# Patient Record
Sex: Female | Born: 1955 | Race: Black or African American | Hispanic: No | Marital: Married | Smoking: Former smoker
Health system: Southern US, Community
[De-identification: ages and names within clinical notes are randomized; demographics above are authoritative.]

## PROBLEM LIST (undated history)

## (undated) ENCOUNTER — Ambulatory Visit: Admission: EM | Payer: BC Managed Care – PPO | Source: Home / Self Care

## (undated) DIAGNOSIS — K635 Polyp of colon: Secondary | ICD-10-CM

## (undated) DIAGNOSIS — K5792 Diverticulitis of intestine, part unspecified, without perforation or abscess without bleeding: Secondary | ICD-10-CM

## (undated) DIAGNOSIS — R87619 Unspecified abnormal cytological findings in specimens from cervix uteri: Secondary | ICD-10-CM

## (undated) DIAGNOSIS — I10 Essential (primary) hypertension: Secondary | ICD-10-CM

## (undated) DIAGNOSIS — E7800 Pure hypercholesterolemia, unspecified: Secondary | ICD-10-CM

## (undated) DIAGNOSIS — D649 Anemia, unspecified: Secondary | ICD-10-CM

## (undated) DIAGNOSIS — E119 Type 2 diabetes mellitus without complications: Secondary | ICD-10-CM

## (undated) HISTORY — DX: Essential (primary) hypertension: I10

## (undated) HISTORY — DX: Pure hypercholesterolemia, unspecified: E78.00

## (undated) HISTORY — DX: Type 2 diabetes mellitus without complications: E11.9

## (undated) HISTORY — PX: BREAST EXCISIONAL BIOPSY: SUR124

## (undated) HISTORY — PX: COLONOSCOPY: SHX174

## (undated) HISTORY — DX: Polyp of colon: K63.5

## (undated) HISTORY — DX: Anemia, unspecified: D64.9

## (undated) HISTORY — DX: Diverticulitis of intestine, part unspecified, without perforation or abscess without bleeding: K57.92

## (undated) HISTORY — DX: Unspecified abnormal cytological findings in specimens from cervix uteri: R87.619

## (undated) SURGERY — Surgical Case
Anesthesia: *Unknown

---

## 1985-06-19 HISTORY — PX: TUBAL LIGATION: SHX77

## 2004-11-04 ENCOUNTER — Other Ambulatory Visit: Admission: RE | Admit: 2004-11-04 | Discharge: 2004-11-04 | Payer: Self-pay | Admitting: Obstetrics and Gynecology

## 2004-11-29 ENCOUNTER — Encounter: Admission: RE | Admit: 2004-11-29 | Discharge: 2004-11-29 | Payer: Self-pay | Admitting: *Deleted

## 2004-12-05 ENCOUNTER — Encounter: Admission: RE | Admit: 2004-12-05 | Discharge: 2004-12-05 | Payer: Self-pay | Admitting: Diagnostic Radiology

## 2005-01-13 ENCOUNTER — Ambulatory Visit (HOSPITAL_COMMUNITY): Admission: RE | Admit: 2005-01-13 | Discharge: 2005-01-13 | Payer: Self-pay | Admitting: Interventional Radiology

## 2005-01-18 ENCOUNTER — Observation Stay (HOSPITAL_COMMUNITY): Admission: RE | Admit: 2005-01-18 | Discharge: 2005-01-19 | Payer: Self-pay | Admitting: Interventional Radiology

## 2005-11-09 ENCOUNTER — Other Ambulatory Visit: Admission: RE | Admit: 2005-11-09 | Discharge: 2005-11-09 | Payer: Self-pay | Admitting: Obstetrics & Gynecology

## 2008-08-20 ENCOUNTER — Other Ambulatory Visit: Admission: RE | Admit: 2008-08-20 | Discharge: 2008-08-20 | Payer: Self-pay | Admitting: Obstetrics & Gynecology

## 2010-07-09 ENCOUNTER — Encounter: Payer: Self-pay | Admitting: Family Medicine

## 2010-07-10 ENCOUNTER — Encounter: Payer: Self-pay | Admitting: Interventional Radiology

## 2011-04-22 LAB — HM MAMMOGRAPHY: HM Mammogram: NEGATIVE

## 2011-07-31 LAB — HM PAP SMEAR: HM Pap smear: NEGATIVE

## 2012-11-04 ENCOUNTER — Encounter: Payer: Self-pay | Admitting: *Deleted

## 2012-11-05 ENCOUNTER — Ambulatory Visit (INDEPENDENT_AMBULATORY_CARE_PROVIDER_SITE_OTHER): Payer: BC Managed Care – PPO | Admitting: Nurse Practitioner

## 2012-11-05 ENCOUNTER — Encounter: Payer: Self-pay | Admitting: Nurse Practitioner

## 2012-11-05 VITALS — BP 110/60 | HR 72 | Ht 64.5 in | Wt 139.4 lb

## 2012-11-05 DIAGNOSIS — Z0000 Encounter for general adult medical examination without abnormal findings: Secondary | ICD-10-CM

## 2012-11-05 DIAGNOSIS — Z01419 Encounter for gynecological examination (general) (routine) without abnormal findings: Secondary | ICD-10-CM

## 2012-11-05 LAB — POCT URINALYSIS DIPSTICK
Leukocytes, UA: NEGATIVE
Spec Grav, UA: 1.015
Urobilinogen, UA: NEGATIVE

## 2012-11-05 NOTE — Progress Notes (Signed)
57 y.o. G2P2 Married African American Fe here for annual exam.  No new problems, still some vaginal dryness. OTC lubrication with help. Still having a significant amount of vaso symptoms - but feels that she can deal with them.  Not really any better than last year. Husband got laid off from his job of 30 years and she is working 2 jobs to make ends meet.  He has now found another job, but does not pay hardly minimum wage.  No LMP recorded. Patient is postmenopausal.          Sexually active: yes  The current method of family planning is post menopausal status.    Exercising: yes  Home exercise routine includes walking 3 hrs per week. Smoker:  no  Health Maintenance: Pap:  07/31/2011  Normal  MMG:  2012 normal she will schedule this summer Colonoscopy:  2012 polyp and diverticulosis recheck in 5 years. Dr. Loreta Ave BMD:   never TDaP:  08/2012 Labs: PCP does lab (blood) work.    reports that she has never smoked. She has never used smokeless tobacco. She reports that she does not drink alcohol or use illicit drugs.  Past Medical History  Diagnosis Date  . Dysmenorrhea   . Menorrhagia   . Hypertension   . Anemia   . Diverticulitis   . Colon polyps     Past Surgical History  Procedure Laterality Date  . Tubal ligation Bilateral 1987  . Colposcopy      multiple polyps and diverticulitis    Current Outpatient Prescriptions  Medication Sig Dispense Refill  . acetaminophen (TYLENOL) 100 MG/ML solution Take 10 mg/kg by mouth every 4 (four) hours as needed for fever.      Marland Kitchen lisinopril-hydrochlorothiazide (PRINZIDE,ZESTORETIC) 10-12.5 MG per tablet Take 1 tablet by mouth daily.      . simvastatin (ZOCOR) 20 MG tablet Take 20 mg by mouth every evening.       No current facility-administered medications for this visit.    Family History  Problem Relation Age of Onset  . Breast cancer Mother   . Heart failure Sister   . Cancer Paternal Uncle     prostate cancer  . Cancer Maternal  Grandmother     ovarian    ROS:  Pertinent items are noted in HPI.  Otherwise, a comprehensive ROS was negative.  Exam:   BP 110/60  Pulse 72  Ht 5' 4.5" (1.638 m)  Wt 139 lb 6.4 oz (63.231 kg)  BMI 23.57 kg/m2 Height: 5' 4.5" (163.8 cm)  Ht Readings from Last 3 Encounters:  11/05/12 5' 4.5" (1.638 m)    General appearance: alert, cooperative and appears stated age Head: Normocephalic, without obvious abnormality, atraumatic Neck: no adenopathy, supple, symmetrical, trachea midline and thyroid normal to inspection and palpation Lungs: clear to auscultation bilaterally Breasts: normal appearance, no masses or tenderness Heart: regular rate and rhythm Abdomen: soft, non-tender; no masses,  no organomegaly Extremities: extremities normal, atraumatic, no cyanosis or edema Skin: Skin color, texture, turgor normal. No rashes or lesions Lymph nodes: Cervical, supraclavicular, and axillary nodes normal. No abnormal inguinal nodes palpated Neurologic: Grossly normal   Pelvic: External genitalia:  no lesions              Urethra:  normal appearing urethra with no masses, tenderness or lesions              Bartholin's and Skene's: normal  Vagina: normal appearing vagina with normal color and discharge, no lesions              Cervix: anteverted              Pap taken: yes including HR HPV Bimanual Exam:  Uterus:  normal size, contour, position, consistency, mobility, non-tender              Adnexa: no mass, fullness, tenderness               Rectovaginal: Confirms               Anus:  normal sphincter tone, no lesions  A:  Well Woman with normal exam  Postmenopausal no HRT - still significant vaso symptoms  P:   Pap smear as per guidelines   Mammogram due now and patient will schedule this summer  Discussed OTC treatments for vaso symptoms and she may try this, OTC lubrication prn  return annually or prn  An After Visit Summary was printed and given to the  patient.

## 2012-11-05 NOTE — Patient Instructions (Addendum)

## 2012-11-06 NOTE — Progress Notes (Signed)
Encounter reviewed by Dr. Jamelle Goldston Silva.  

## 2013-11-06 ENCOUNTER — Ambulatory Visit: Payer: BC Managed Care – PPO | Admitting: Nurse Practitioner

## 2014-02-16 ENCOUNTER — Ambulatory Visit (INDEPENDENT_AMBULATORY_CARE_PROVIDER_SITE_OTHER): Payer: BC Managed Care – PPO | Admitting: Nurse Practitioner

## 2014-02-16 ENCOUNTER — Encounter: Payer: Self-pay | Admitting: Nurse Practitioner

## 2014-02-16 VITALS — BP 122/84 | HR 84 | Ht 64.75 in | Wt 133.0 lb

## 2014-02-16 DIAGNOSIS — Z01419 Encounter for gynecological examination (general) (routine) without abnormal findings: Secondary | ICD-10-CM

## 2014-02-16 NOTE — Progress Notes (Signed)
Encounter reviewed by Dr. Brook Silva.  

## 2014-02-16 NOTE — Patient Instructions (Signed)

## 2014-02-16 NOTE — Progress Notes (Addendum)
Patient ID: Cheryl Cain, female   DOB: 19-Aug-1955, 58 y.o.   MRN: [id number] 58 y.o. G2P2 Married African American Fe here for annual exam.  Still having some vaso symptoms that are tolerable at times. Now having to work 2 jobs. Husband's work situation is a bad and he got laid off for 2 years.  Now back to work.   Patient's last menstrual period was 10/17/2009.          Sexually active: yes  The current method of family planning is post menopausal status.  Exercising: yes Home exercise routine includes walking 3 hrs per week.  Smoker: no   Health Maintenance:  Pap: 11/05/12, WNL, neg HR HPV MMG: 04/05/13, Bi-Rads 1: negative  Colonoscopy: 2012 polyp and diverticulosis recheck in 5 years. Dr. Collene Mares  BMD: never  TDaP: 08/2012  Labs: PCP does lab (blood) work.   reports that she has never smoked. She has never used smokeless tobacco. She reports that she does not drink alcohol or use illicit drugs.  Past Medical History  Diagnosis Date  . Dysmenorrhea   . Menorrhagia   . Hypertension   . Anemia   . Diverticulitis   . Colon polyps     Past Surgical History  Procedure Laterality Date  . Tubal ligation Bilateral 1987  . Colposcopy      multiple polyps and diverticulitis  . 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 aspirin (ECOTRIN LOW STRENGTH) 81 MG EC tablet Take 81 mg by mouth daily. Swallow whole.      . lisinopril-hydrochlorothiazide (PRINZIDE,ZESTORETIC) 20-25 MG per tablet Take 1 tablet by mouth daily.      . simvastatin (ZOCOR) 40 MG tablet Take 40 mg by mouth daily.       No current facility-administered medications for this visit.    Family History  Problem Relation Age of Onset  . Breast cancer Mother   . Hypertension Sister   . Cancer Paternal Uncle     prostate cancer  . Cancer Maternal Grandmother     ovarian  . Heart failure Father   . Hypertension Brother   .  Hypertension Sister   . Heart failure Sister     ROS:  Pertinent items are noted in HPI.  Otherwise, a comprehensive ROS was negative.  Exam:   BP 122/84  Pulse 84  Ht 5' 4.75" (1.645 m)  Wt 133 lb (60.328 kg)  BMI 22.29 kg/m2  LMP 10/17/2009 Height: 5' 4.75" (164.5 cm)  Ht Readings from Last 3 Encounters:  02/16/14 5' 4.75" (1.645 m)  11/05/12 5' 4.5" (1.638 m)    General appearance: alert, cooperative and appears stated age Head: Normocephalic, without obvious abnormality, atraumatic Neck: no adenopathy, supple, symmetrical, trachea midline and thyroid normal to inspection and palpation Lungs: clear to auscultation bilaterally Breasts: normal appearance, no masses or tenderness Heart: regular rate and rhythm Abdomen: soft, non-tender; no masses,  no organomegaly Extremities: extremities normal, atraumatic, no cyanosis or edema Skin: Skin color, texture, turgor normal. No rashes or lesions Lymph nodes: Cervical, supraclavicular, and axillary nodes normal. No abnormal inguinal nodes palpated Neurologic: Grossly normal   Pelvic: External genitalia:  no lesions              Urethra:  normal appearing urethra with no masses, tenderness or lesions              Bartholin's and Skene's: normal  Vagina: normal appearing vagina with normal color and discharge, no lesions              Cervix: anteverted              Pap taken: No. Bimanual Exam:  Uterus:  normal size, contour, position, consistency, mobility, non-tender              Adnexa: no mass, fullness, tenderness               Rectovaginal: Confirms               Anus:  normal sphincter tone, no lesions  A:  Well Woman with normal exam  Postmenopausal no HRT  History of OV cyst 2011 - no pain  [place] of Breast Cancer & OV cancer - declines BRCA testing    P:   Reviewed health and wellness pertinent to exam  Pap smear not taken today  Mammogram is due 03/2014  Counseled on breast self exam, mammography  screening, adequate intake of calcium and vitamin D, diet and exercise, Kegel's exercises return annually or prn  An After Visit Summary was printed and given to the patient.

## 2014-03-21 ENCOUNTER — Ambulatory Visit (HOSPITAL_COMMUNITY)
Admission: EM | Admit: 2014-03-21 | Discharge: 2014-03-21 | Disposition: A | Discharge status: Home / Self Care | Payer: Worker's Compensation | Attending: Emergency Medicine | Admitting: Emergency Medicine

## 2014-03-21 ENCOUNTER — Encounter (HOSPITAL_COMMUNITY): Payer: Worker's Compensation | Admitting: Anesthesiology

## 2014-03-21 ENCOUNTER — Emergency Department (HOSPITAL_COMMUNITY): Payer: Worker's Compensation | Admitting: Anesthesiology

## 2014-03-21 ENCOUNTER — Emergency Department (HOSPITAL_COMMUNITY): Payer: Worker's Compensation

## 2014-03-21 ENCOUNTER — Encounter (HOSPITAL_COMMUNITY)
Admission: EM | Disposition: A | Discharge status: Home / Self Care | Payer: Self-pay | Source: Home / Self Care | Attending: Emergency Medicine

## 2014-03-21 ENCOUNTER — Emergency Department (HOSPITAL_COMMUNITY)
Admission: EM | Admit: 2014-03-21 | Discharge: 2014-03-21 | Disposition: A | Discharge status: Home / Self Care | Payer: Worker's Compensation | Attending: Emergency Medicine | Admitting: Emergency Medicine

## 2014-03-21 ENCOUNTER — Encounter (HOSPITAL_COMMUNITY): Payer: Self-pay | Admitting: Emergency Medicine

## 2014-03-21 DIAGNOSIS — Z4789 Encounter for other orthopedic aftercare: Secondary | ICD-10-CM | POA: Diagnosis not present

## 2014-03-21 DIAGNOSIS — I10 Essential (primary) hypertension: Secondary | ICD-10-CM | POA: Insufficient documentation

## 2014-03-21 DIAGNOSIS — D649 Anemia, unspecified: Secondary | ICD-10-CM | POA: Diagnosis not present

## 2014-03-21 DIAGNOSIS — Z8742 Personal history of other diseases of the female genital tract: Secondary | ICD-10-CM | POA: Insufficient documentation

## 2014-03-21 DIAGNOSIS — Z7982 Long term (current) use of aspirin: Secondary | ICD-10-CM | POA: Insufficient documentation

## 2014-03-21 DIAGNOSIS — X58XXXA Exposure to other specified factors, initial encounter: Secondary | ICD-10-CM | POA: Insufficient documentation

## 2014-03-21 DIAGNOSIS — Z792 Long term (current) use of antibiotics: Secondary | ICD-10-CM | POA: Diagnosis not present

## 2014-03-21 DIAGNOSIS — Z86010 Personal history of colonic polyps: Secondary | ICD-10-CM | POA: Diagnosis not present

## 2014-03-21 DIAGNOSIS — Z862 Personal history of diseases of the blood and blood-forming organs and certain disorders involving the immune mechanism: Secondary | ICD-10-CM | POA: Diagnosis not present

## 2014-03-21 DIAGNOSIS — Z79899 Other long term (current) drug therapy: Secondary | ICD-10-CM | POA: Diagnosis not present

## 2014-03-21 DIAGNOSIS — S68119A Complete traumatic metacarpophalangeal amputation of unspecified finger, initial encounter: Secondary | ICD-10-CM | POA: Diagnosis present

## 2014-03-21 DIAGNOSIS — Y929 Unspecified place or not applicable: Secondary | ICD-10-CM | POA: Insufficient documentation

## 2014-03-21 DIAGNOSIS — Z87898 Personal history of other specified conditions: Secondary | ICD-10-CM

## 2014-03-21 DIAGNOSIS — S68612A Complete traumatic transphalangeal amputation of right middle finger, initial encounter: Secondary | ICD-10-CM | POA: Diagnosis not present

## 2014-03-21 DIAGNOSIS — Z8719 Personal history of other diseases of the digestive system: Secondary | ICD-10-CM | POA: Diagnosis not present

## 2014-03-21 DIAGNOSIS — S61209A Unspecified open wound of unspecified finger without damage to nail, initial encounter: Secondary | ICD-10-CM

## 2014-03-21 HISTORY — PX: AMPUTATION: SHX166

## 2014-03-21 LAB — CBC WITH DIFFERENTIAL/PLATELET
Basophils Absolute: 0 10*3/uL (ref 0.0–0.1)
Basophils Relative: 0 % (ref 0–1)
EOS ABS: 0.1 10*3/uL (ref 0.0–0.7)
Eosinophils Relative: 2 % (ref 0–5)
HEMATOCRIT: 36.8 % (ref 36.0–46.0)
Hemoglobin: 12.2 g/dL (ref 12.0–15.0)
LYMPHS PCT: 31 % (ref 12–46)
Lymphs Abs: 1.7 10*3/uL (ref 0.7–4.0)
MCH: 29 pg (ref 26.0–34.0)
MCHC: 33.2 g/dL (ref 30.0–36.0)
MCV: 87.6 fL (ref 78.0–100.0)
MONO ABS: 0.5 10*3/uL (ref 0.1–1.0)
MONOS PCT: 9 % (ref 3–12)
NEUTROS ABS: 3.1 10*3/uL (ref 1.7–7.7)
NEUTROS PCT: 58 % (ref 43–77)
PLATELETS: 343 10*3/uL (ref 150–400)
RBC: 4.2 MIL/uL (ref 3.87–5.11)
RDW: 13.2 % (ref 11.5–15.5)
WBC: 5.4 10*3/uL (ref 4.0–10.5)

## 2014-03-21 LAB — BASIC METABOLIC PANEL
ANION GAP: 13 (ref 5–15)
BUN: 15 mg/dL (ref 6–23)
CO2: 25 meq/L (ref 19–32)
Calcium: 9.6 mg/dL (ref 8.4–10.5)
Chloride: 100 mEq/L (ref 96–112)
Creatinine, Ser: 0.57 mg/dL (ref 0.50–1.10)
GFR calc Af Amer: >90 mL/min (ref >90)
Glucose, Bld: 147 mg/dL — ABNORMAL HIGH (ref 70–99)
Potassium: 3.4 mEq/L — ABNORMAL LOW (ref 3.7–5.3)
SODIUM: 138 meq/L (ref 137–147)

## 2014-03-21 LAB — I-STAT CHEM 8, ED
BUN: 14 mg/dL (ref 6–23)
CHLORIDE: 104 meq/L (ref 96–112)
CREATININE: 0.6 mg/dL (ref 0.50–1.10)
Calcium, Ion: 1.16 mmol/L (ref 1.12–1.23)
Glucose, Bld: 147 mg/dL — ABNORMAL HIGH (ref 70–99)
HEMATOCRIT: 40 % (ref 36.0–46.0)
HEMOGLOBIN: 13.6 g/dL (ref 12.0–15.0)
POTASSIUM: 3.2 meq/L — AB (ref 3.7–5.3)
Sodium: 139 mEq/L (ref 137–147)
TCO2: 24 mmol/L (ref 0–100)

## 2014-03-21 LAB — SAMPLE TO BLOOD BANK

## 2014-03-21 SURGERY — Surgical Case
Anesthesia: *Unknown

## 2014-03-21 SURGERY — AMPUTATION DIGIT
Anesthesia: General | Site: Finger | Laterality: Right

## 2014-03-21 MED ORDER — ONDANSETRON HCL 4 MG/2ML IJ SOLN
4.0000 mg | Freq: Once | INTRAMUSCULAR | Status: AC
  Administered 2014-03-21: 4 mg via INTRAVENOUS
  Filled 2014-03-21: qty 2

## 2014-03-21 MED ORDER — ONDANSETRON HCL 4 MG/2ML IJ SOLN
INTRAMUSCULAR | Status: AC
  Filled 2014-03-21: qty 2

## 2014-03-21 MED ORDER — MORPHINE SULFATE 4 MG/ML IJ SOLN
4.0000 mg | INTRAMUSCULAR | Status: DC | PRN
  Administered 2014-03-21: 4 mg via INTRAVENOUS
  Filled 2014-03-21: qty 1

## 2014-03-21 MED ORDER — FENTANYL CITRATE 0.05 MG/ML IJ SOLN
INTRAMUSCULAR | Status: DC | PRN
  Administered 2014-03-21: 100 ug via INTRAVENOUS
  Administered 2014-03-21: 150 ug via INTRAVENOUS

## 2014-03-21 MED ORDER — FENTANYL CITRATE 0.05 MG/ML IJ SOLN
INTRAMUSCULAR | Status: AC
  Filled 2014-03-21: qty 5

## 2014-03-21 MED ORDER — BUPIVACAINE-EPINEPHRINE (PF) 0.5% -1:200000 IJ SOLN
INTRAMUSCULAR | Status: AC
  Filled 2014-03-21: qty 30

## 2014-03-21 MED ORDER — DEXAMETHASONE SODIUM PHOSPHATE 4 MG/ML IJ SOLN
INTRAMUSCULAR | Status: AC
  Filled 2014-03-21: qty 1

## 2014-03-21 MED ORDER — CEFAZOLIN SODIUM-DEXTROSE 2-3 GM-% IV SOLR
INTRAVENOUS | Status: DC | PRN
  Administered 2014-03-21: 2 g via INTRAVENOUS

## 2014-03-21 MED ORDER — HYDROMORPHONE HCL 1 MG/ML IJ SOLN: 0.2500 mg | INTRAMUSCULAR | Status: DC | PRN

## 2014-03-21 MED ORDER — OXYCODONE-ACETAMINOPHEN 5-325 MG PO TABS: 1.0000 | ORAL_TABLET | ORAL | Status: DC | PRN

## 2014-03-21 MED ORDER — LACTATED RINGERS IV SOLN
INTRAVENOUS | Status: DC
  Administered 2014-03-21: 13:00:00 via INTRAVENOUS

## 2014-03-21 MED ORDER — LIDOCAINE HCL 2 % IJ SOLN
20.0000 mL | Freq: Once | INTRAMUSCULAR | Status: DC
  Filled 2014-03-21: qty 20

## 2014-03-21 MED ORDER — SODIUM CHLORIDE 0.9 % IV BOLUS (SEPSIS)
1000.0000 mL | Freq: Once | INTRAVENOUS | Status: AC
  Administered 2014-03-21: 1000 mL via INTRAVENOUS

## 2014-03-21 MED ORDER — PROPOFOL 10 MG/ML IV BOLUS
INTRAVENOUS | Status: AC
  Filled 2014-03-21: qty 20

## 2014-03-21 MED ORDER — BACITRACIN ZINC 500 UNIT/GM EX OINT
TOPICAL_OINTMENT | CUTANEOUS | Status: AC
  Filled 2014-03-21: qty 15

## 2014-03-21 MED ORDER — DEXAMETHASONE SODIUM PHOSPHATE 4 MG/ML IJ SOLN
INTRAMUSCULAR | Status: DC | PRN
  Administered 2014-03-21: 4 mg via INTRAVENOUS

## 2014-03-21 MED ORDER — OXYCODONE HCL 5 MG PO TABS: 5.0000 mg | ORAL_TABLET | Freq: Once | ORAL | Status: DC | PRN

## 2014-03-21 MED ORDER — PROPOFOL 10 MG/ML IV BOLUS
INTRAVENOUS | Status: DC | PRN
  Administered 2014-03-21: 120 mg via INTRAVENOUS

## 2014-03-21 MED ORDER — HYDROMORPHONE HCL 1 MG/ML IJ SOLN
0.5000 mg | Freq: Once | INTRAMUSCULAR | Status: AC
  Administered 2014-03-21: 0.5 mg via INTRAVENOUS
  Filled 2014-03-21: qty 1

## 2014-03-21 MED ORDER — OXYCODONE HCL 5 MG/5ML PO SOLN: 5.0000 mg | Freq: Once | ORAL | Status: DC | PRN

## 2014-03-21 MED ORDER — 0.9 % SODIUM CHLORIDE (POUR BTL) OPTIME
TOPICAL | Status: DC | PRN
  Administered 2014-03-21: 1000 mL

## 2014-03-21 MED ORDER — LACTATED RINGERS IV SOLN
INTRAVENOUS | Status: DC | PRN
  Administered 2014-03-21: 13:00:00 via INTRAVENOUS

## 2014-03-21 MED ORDER — ONDANSETRON HCL 4 MG/2ML IJ SOLN
INTRAMUSCULAR | Status: DC | PRN
  Administered 2014-03-21: 4 mg via INTRAVENOUS

## 2014-03-21 MED ORDER — AMOXICILLIN-POT CLAVULANATE 875-125 MG PO TABS: 1.0000 | ORAL_TABLET | Freq: Two times a day (BID) | ORAL | Status: AC

## 2014-03-21 MED ORDER — MIDAZOLAM HCL 2 MG/2ML IJ SOLN
INTRAMUSCULAR | Status: AC
  Filled 2014-03-21: qty 2

## 2014-03-21 MED ORDER — SUCCINYLCHOLINE CHLORIDE 20 MG/ML IJ SOLN
INTRAMUSCULAR | Status: DC | PRN
  Administered 2014-03-21: 100 mg via INTRAVENOUS

## 2014-03-21 MED ORDER — MIDAZOLAM HCL 2 MG/2ML IJ SOLN
INTRAMUSCULAR | Status: DC | PRN
  Administered 2014-03-21: 2 mg via INTRAVENOUS

## 2014-03-21 MED ORDER — BUPIVACAINE-EPINEPHRINE 0.5% -1:200000 IJ SOLN
INTRAMUSCULAR | Status: DC | PRN
  Administered 2014-03-21: 8 mL

## 2014-03-21 MED ORDER — LIDOCAINE HCL (CARDIAC) 20 MG/ML IV SOLN
INTRAVENOUS | Status: DC | PRN
  Administered 2014-03-21: 40 mg via INTRAVENOUS

## 2014-03-21 MED ORDER — CEFAZOLIN SODIUM 1-5 GM-% IV SOLN
1.0000 g | Freq: Once | INTRAVENOUS | Status: AC
  Administered 2014-03-21: 1 g via INTRAVENOUS
  Filled 2014-03-21: qty 50

## 2014-03-21 MED ORDER — SUCCINYLCHOLINE CHLORIDE 20 MG/ML IJ SOLN
INTRAMUSCULAR | Status: AC
  Filled 2014-03-21: qty 1

## 2014-03-21 SURGICAL SUPPLY — 37 items
BANDAGE COBAN STERILE 2 (GAUZE/BANDAGES/DRESSINGS) IMPLANT
BLADE AVERAGE 25X9 (BLADE) IMPLANT
BNDG COHESIVE 1X5 TAN STRL LF (GAUZE/BANDAGES/DRESSINGS) ×2 IMPLANT
BNDG COHESIVE 4X5 TAN NS LF (GAUZE/BANDAGES/DRESSINGS) IMPLANT
BNDG CONFORM 2 STRL LF (GAUZE/BANDAGES/DRESSINGS) ×2 IMPLANT
BNDG CONFORM 3 STRL LF (GAUZE/BANDAGES/DRESSINGS) ×2 IMPLANT
BNDG ESMARK 4X9 LF (GAUZE/BANDAGES/DRESSINGS) IMPLANT
BNDG GAUZE ELAST 4 BULKY (GAUZE/BANDAGES/DRESSINGS) IMPLANT
CHLORAPREP W/TINT 26ML (MISCELLANEOUS) ×2 IMPLANT
CORDS BIPOLAR (ELECTRODE) ×2 IMPLANT
DRAPE SURG 17X23 STRL (DRAPES) ×2 IMPLANT
DRSG EMULSION OIL 3X3 NADH (GAUZE/BANDAGES/DRESSINGS) ×2 IMPLANT
ELECT REM PT RETURN 9FT ADLT (ELECTROSURGICAL) ×2
ELECTRODE REM PT RTRN 9FT ADLT (ELECTROSURGICAL) ×1 IMPLANT
GAUZE SPONGE 2X2 8PLY STRL LF (GAUZE/BANDAGES/DRESSINGS) ×1 IMPLANT
GAUZE SPONGE 4X4 12PLY STRL (GAUZE/BANDAGES/DRESSINGS) ×2 IMPLANT
GAUZE XEROFORM 1X8 LF (GAUZE/BANDAGES/DRESSINGS) IMPLANT
GLOVE BIO SURGEON STRL SZ7.5 (GLOVE) ×2 IMPLANT
GLOVE BIOGEL PI IND STRL 8 (GLOVE) ×1 IMPLANT
GLOVE BIOGEL PI INDICATOR 8 (GLOVE) ×1
GOWN STRL REUS W/ TWL XL LVL3 (GOWN DISPOSABLE) ×1 IMPLANT
GOWN STRL REUS W/TWL XL LVL3 (GOWN DISPOSABLE) ×1
KIT BASIN OR (CUSTOM PROCEDURE TRAY) ×2 IMPLANT
NEEDLE HYPO 25X1 1.5 SAFETY (NEEDLE) IMPLANT
PACK ORTHO EXTREMITY (CUSTOM PROCEDURE TRAY) ×2 IMPLANT
PAD CAST 4YDX4 CTTN HI CHSV (CAST SUPPLIES) IMPLANT
PADDING CAST ABS 4INX4YD NS (CAST SUPPLIES) ×1
PADDING CAST ABS COTTON 4X4 ST (CAST SUPPLIES) ×1 IMPLANT
PADDING CAST COTTON 4X4 STRL (CAST SUPPLIES)
PENCIL BUTTON HOLSTER BLD 10FT (ELECTRODE) IMPLANT
SPONGE GAUZE 2X2 STER 10/PKG (GAUZE/BANDAGES/DRESSINGS) ×1
SUT ETHILON 4 0 PS 2 18 (SUTURE) IMPLANT
SUT MNCRL AB 4-0 PS2 18 (SUTURE) IMPLANT
SUT VICRYL RAPIDE 4/0 PS 2 (SUTURE) ×2 IMPLANT
SYRINGE 10CC LL (SYRINGE) IMPLANT
TOWEL OR 17X24 6PK STRL BLUE (TOWEL DISPOSABLE) ×2 IMPLANT
UNDERPAD 30X30 INCONTINENT (UNDERPADS AND DIAPERS) ×2 IMPLANT

## 2014-03-21 NOTE — ED Notes (Signed)
Hand clean and examined by provider

## 2014-03-21 NOTE — ED Provider Notes (Signed)
CSN: [id number]     Arrival date & time 03/21/14  2106 History  This chart was scribed for non-physician practitioner working with Cheryl Sorrow, MD by Mercy Moore, ED Scribe. This patient was seen in room TR08C/TR08C and the patient's care was started at 9:46 PM.    Chief Complaint  Patient presents with  . Finger Injury   The history is provided by the patient. No language interpreter was used.   HPI Comments: Cheryl Cain is a 58 y.o. female who presents to the Emergency Department with post operative concerns. Today patient fully amputated distal tip of right third finger while pushing carts at work. Patient admitted for surgery to have the severed piece reattached; however, finger was unable to be reattached so the bone was shaved and the site closed. Patient has returned to ED because her bandage fell off at home. Patient denies additional injury.   Patient has taken her pain medication; she reports well managed pain. She denies any changes or worsening.   Past Medical History  Diagnosis Date  . Dysmenorrhea   . Menorrhagia   . Hypertension   . Anemia   . Diverticulitis   . Colon polyps    Past Surgical History  Procedure Laterality Date  . Tubal ligation Bilateral 1987  . Colposcopy      multiple polyps and diverticulitis  . 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 Breast cancer Mother   . Hypertension Sister   . Cancer Paternal Uncle     prostate cancer  . Cancer Maternal Grandmother     ovarian  . Heart failure Father   . Hypertension Brother   . Hypertension Sister   . Heart failure Sister    History  Substance Use Topics  . Smoking status: Never Smoker   . Smokeless tobacco: Never Used  . Alcohol Use: No   OB History   Grav Para Term Preterm Abortions TAB SAB Ect Mult Living   2 2        2      Review of Systems  Musculoskeletal: Positive for arthralgias.  All other systems  reviewed and are negative.  Allergies  Review of patient's allergies indicates no known allergies.  Home Medications   Prior to Admission medications   Medication Sig Start Date End Date Taking? Authorizing Provider  amoxicillin-clavulanate (AUGMENTIN) 875-125 MG per tablet Take 1 tablet by mouth 2 (two) times daily. 03/21/14 03/24/14  Jolyn Nap, MD  aspirin (ECOTRIN LOW STRENGTH) 81 MG EC tablet Take 81 mg by mouth daily. Swallow whole.    Historical Provider, MD  lisinopril-hydrochlorothiazide (PRINZIDE,ZESTORETIC) 20-25 MG per tablet Take 1 tablet by mouth daily.    Historical Provider, MD  oxyCODONE-acetaminophen (ROXICET) 5-325 MG per tablet Take 1-2 tablets by mouth every 4 (four) hours as needed. 03/21/14   Jolyn Nap, MD  simvastatin (ZOCOR) 40 MG tablet Take 40 mg by mouth daily.    Historical Provider, MD   Triage Vitals: BP 157/76  Pulse 108  Temp(Src) 98.3 F (36.8 C) (Oral)  Resp 16  Ht 5\' 4"  (1.626 m)  Wt 134 lb (60.782 kg)  BMI 22.99 kg/m2  SpO2 97%  LMP 10/17/2009  Physical Exam  Nursing note and vitals reviewed. Constitutional: She is oriented to person, place, and time. She appears well-developed and well-nourished. No distress.  HENT:  Head: Normocephalic and atraumatic.  Eyes: EOM are normal.  Neck: Neck supple. No tracheal deviation present.  Cardiovascular: Normal rate.   Pulmonary/Chest: Effort normal. No respiratory distress.  Musculoskeletal: Normal range of motion.  Neurological: She is alert and oriented to person, place, and time.  Skin: Skin is warm and dry.  No redness, drainage, warmth, and no suture loosening from surgical site.   Psychiatric: She has a normal mood and affect. Her behavior is normal.    ED Course  Procedures (including critical care time)  COORDINATION OF CARE: 9:46 PM- Plans to redress finger. Discussed treatment plan with patient at bedside and patient agreed to plan.   Labs Review Labs Reviewed - No data to  display  Imaging Review Dg Finger Middle Right  03/21/2014   CLINICAL DATA:  Pushing cart at work earlier today and stuck finger with metal pole. Injury to tip of finger.  EXAM: RIGHT MIDDLE FINGER 2+V  COMPARISON:  None.  FINDINGS: There is a comminuted, displaced and likely compound fracture involving the tuft of the distal phalanx of the middle digit. No definite intra-articular extension Extensive adjacent subcutaneous stranding. No radiopaque foreign body. Joint spaces are preserved. No erosions.  IMPRESSION: Comminuted, displaced and likely compound fracture involving the tuft of the distal phalanx of the middle digit without definite intra-articular extension. No radiopaque foreign body.   Electronically Signed   By: Sandi Mariscal M.D.   On: 03/21/2014 10:05     EKG Interpretation None      MDM   Final diagnoses:  No post-op complications   58 yo female presenting to ED after her bandage fell off her finger at home.  She had surgery on the affected finger this am for fracture an subsequent amputation of distal tip of her finger.  She did not sustain any new injury and she has no pain, redness, drainage or any other concerns regarding the affected finger.  The dressing was replaced and her discharge instructions are to continue current follow-up plan as instructed by hand surgeon.  Pt agreeable with plan.  Return precautions provided.     Filed Vitals:   03/21/14 2122 03/21/14 2158  BP: 157/76 136/76  Pulse: 108 93  Temp: 98.3 F (36.8 C) 98 F (36.7 C)  TempSrc: Oral   Resp: 16 16  Height: 5\' 4"  (1.626 m)   Weight: 134 lb (60.782 kg)   SpO2: 97% 95%   Meds given in ED:  Medications - No data to display  Discharge Medication List as of 03/21/2014 10:01 PM     I personally performed the services described in this documentation, which was scribed in my presence. The recorded information has been reviewed and is accurate.    Britt Bottom, NP 03/26/14 [phone]

## 2014-03-21 NOTE — ED Notes (Signed)
Pt alert and oriented on arrival states that she smashed her finger at work this am.  Pt tip on right middle finger has been severed. Pt supervisor at bedside

## 2014-03-21 NOTE — Transfer of Care (Signed)
Imm ediate Anesthesia Transfer of Care Note  Patient: Cheryl Cain  Procedure(s) Performed: Procedure(s): RIGHT LONG FINGER AMPUTATION REVISION (Right)  Patient Location: PACU  Anesthesia Type:General  Level of Consciousness: awake  Airway & Oxygen Therapy: Patient Spontanous Breathing  Post-op Assessment: Report given to PACU RN and Post -op Vital signs reviewed and stable  Post vital signs: Reviewed and stable  Complications: No apparent anesthesia complications

## 2014-03-21 NOTE — Anesthesia Preprocedure Evaluation (Addendum)
Anesthesia Evaluation  Patient identified by MRN, date of birth, ID band Patient awake    Reviewed: Allergy & Precautions, H&P , NPO status , Patient's Chart, lab work & pertinent test results  Airway Mallampati: II TM Distance: >3 FB Neck ROM: Full    Dental no notable dental hx. (+) Teeth Intact, Dental Advisory Given   Pulmonary neg pulmonary ROS,  breath sounds clear to auscultation  Pulmonary exam normal       Cardiovascular hypertension, Pt. on medications Rhythm:Regular Rate:Normal     Neuro/Psych negative neurological ROS  negative psych ROS   GI/Hepatic negative GI ROS, Neg liver ROS,   Endo/Other  negative endocrine ROS  Renal/GU negative Renal ROS  negative genitourinary   Musculoskeletal   Abdominal   Peds  Hematology negative hematology ROS (+) anemia ,   Anesthesia Other Findings   Reproductive/Obstetrics negative OB ROS                        Anesthesia Physical Anesthesia Plan  ASA: II and emergent  Anesthesia Plan: General   Post-op Pain Management:    Induction: Intravenous, Rapid sequence and Cricoid pressure planned  Airway Management Planned: Oral ETT  Additional Equipment:   Intra-op Plan:   Post-operative Plan: Extubation in OR  Informed Consent: I have reviewed the patients History and Physical, chart, labs and discussed the procedure including the risks, benefits and alternatives for the proposed anesthesia with the patient or authorized representative who has indicated his/her understanding and acceptance.   Dental advisory given  Plan Discussed with: CRNA  Anesthesia Plan Comments:        Anesthesia Quick Evaluation

## 2014-03-21 NOTE — ED Notes (Signed)
Pt had surgery on R middle finger this afternoon to have tip of ginger removed. Pt concerned because bandage fell off at home. Bandage reapplied in triage. Bleeding controlled. No other complaints l.

## 2014-03-21 NOTE — Op Note (Signed)
03/21/2014  1:45 PM  PATIENT:  Cheryl Cain  58 y.o. female  PRE-OPERATIVE DIAGNOSIS:  Right long finger traumatic amputation  POST-OPERATIVE DIAGNOSIS:  Same  PROCEDURE:  Right long finger amputation through middle phalanx  SURGEON: Rayvon Char. Grandville Silos, MD  PHYSICIAN ASSISTANT: None  ANESTHESIA:  general  SPECIMENS:  None  DRAINS:   None  PREOPERATIVE INDICATIONS:  Cheryl Cain is a  58 y.o. female with traumatic amputation of the right long finger tip.  The risks benefits and alternatives were discussed with the patient preoperatively including but not limited to the risks of infection, bleeding, nerve injury, cardiopulmonary complications, the need for revision surgery, among others, and the patient verbalized understanding and consented to proceed.  OPERATIVE IMPLANTS: None  OPERATIVE PROCEDURE:  After receiving prophylactic antibiotics, the patient was escorted to the operative theatre and placed in a supine position.  General anesthesia was administered A surgical "time-out" was performed during which the planned procedure, proposed operative site, and the correct patient identity were compared to the operative consent and agreement confirmed by the circulating nurse according to current facility policy.  Half percent Marcaine with epinephrine was instilled at the base of the digit as a digital block. Following application of a tourniquet to the operative extremity, the tourniquet was inflated and the exposed skin was pre-scrubbed with Betadine scrub and draped in the usual sterile fashion.    The dorsal skin from the level of the DIP joint distally was excised in order to excise all of the nail complex that remained. The distal phalanx remnant was further excised with transection of the FDP. The distal end of the middle phalanx was shaped to a removable was portions of the condyles but not necessarily shortened. The tourniquet was released. The wound was copiously  irrigated and the palmar flap was trimmed to have a nice edge for reapproximation. The palmar flap was brought dorsally and secured to the dorsal flap with 4-0 Vicryl Rapide interrupted sutures. The palmar flap had to be trimmed for better fit with insetting.  A light dressing was applied to the digit and she was awakened and taken to recovery in stable condition, breathing spontaneously  DISPOSITION: She'll be discharged home today with oral antibiotics and followup plan arranged. No work until at least her first followup visit in 7-10 days.

## 2014-03-21 NOTE — ED Notes (Signed)
Pt to radiology via stetcher

## 2014-03-21 NOTE — ED Notes (Signed)
Returned from radiology. 

## 2014-03-21 NOTE — ED Notes (Signed)
Clothing removed and placed in belongings bag

## 2014-03-21 NOTE — Anesthesia Postprocedure Evaluation (Signed)
  Anesthesia Post-op Note  Patient: Cheryl Cain  Procedure(s) Performed: Procedure(s): RIGHT LONG FINGER AMPUTATION REVISION (Right)  Patient Location: PACU  Anesthesia Type:General  Level of Consciousness: awake and alert   Airway and Oxygen Therapy: Patient Spontanous Breathing  Post-op Pain: none  Post-op Assessment: Post-op Vital signs reviewed, Patient's Cardiovascular Status Stable and Respiratory Function Stable  Post-op Vital Signs: Reviewed  Filed Vitals:   03/21/14 1403  BP: 124/57  Pulse: 115  Temp:   Resp: 20    Complications: No apparent anesthesia complications

## 2014-03-21 NOTE — ED Notes (Signed)
Husband at bedside.  

## 2014-03-21 NOTE — ED Notes (Signed)
Multiple family members at bedside.  OR consent signed

## 2014-03-21 NOTE — ED Notes (Signed)
Pt awaiting hand surgeon.  Family and employer at bedside

## 2014-03-21 NOTE — ED Provider Notes (Signed)
CSN: [id number]     Arrival date & time 03/21/14  0808 History   First MD Initiated Contact with Patient 03/21/14 [phone]     Chief Complaint  Patient presents with  . Finger Injury     (Consider location/radiation/quality/duration/timing/severity/associated sxs/prior Treatment) HPI  Cheryl Cain is a 58 y.o. female with past medical history significant for hypertension presenting for evaluation of left middle digit injury occurring at 7:45 AM. Patient was at her job and the finger stuck between a cart she was pushing and a metal column. Patient states blood loss was moderate. States she is anxious. States last tetanus shot was within the last 2 years. She is right-hand dominant. Pain is moderate and exacerbated by movement and palpation. Not anticoagulated however she takes a daily low-dose aspirin. Denies history of diabetes, chronic steroid use. Patient has brought in the tip of the finger with her it was transported in between 2x ice packs, it was not exposed to water.  Past Medical History  Diagnosis Date  . Dysmenorrhea   . Menorrhagia   . Hypertension   . Anemia   . Diverticulitis   . Colon polyps    Past Surgical History  Procedure Laterality Date  . Tubal ligation Bilateral 1987  . Colposcopy      multiple polyps and diverticulitis  . 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 Breast cancer Mother   . Hypertension Sister   . Cancer Paternal Uncle     prostate cancer  . Cancer Maternal Grandmother     ovarian  . Heart failure Father   . Hypertension Brother   . Hypertension Sister   . Heart failure Sister    History  Substance Use Topics  . Smoking status: Never Smoker   . Smokeless tobacco: Never Used  . Alcohol Use: No   OB History   Grav Para Term Preterm Abortions TAB SAB Ect Mult Living   2 2        2      Review of Systems  10 systems reviewed and found to be negative, except as noted  in the HPI.   Allergies  Review of patient's allergies indicates no known allergies.  Home Medications   Prior to Admission medications   Medication Sig Start Date End Date Taking? Authorizing Provider  aspirin (ECOTRIN LOW STRENGTH) 81 MG EC tablet Take 81 mg by mouth daily. Swallow whole.   Yes Historical Provider, MD  lisinopril-hydrochlorothiazide (PRINZIDE,ZESTORETIC) 20-25 MG per tablet Take 1 tablet by mouth daily.   Yes Historical Provider, MD  simvastatin (ZOCOR) 40 MG tablet Take 40 mg by mouth daily.   Yes Historical Provider, MD   BP 156/77  Pulse 100  Temp(Src) 99.3 F (37.4 C)  Resp 18  SpO2 98%  LMP 10/17/2009 Physical Exam  Nursing note and vitals reviewed. Constitutional: She is oriented to person, place, and time. She appears well-developed and well-nourished. No distress.  HENT:  Head: Normocephalic.  Eyes: Conjunctivae and EOM are normal.  Cardiovascular:  Tachycardic in the 120s to 130s  Pulmonary/Chest: Effort normal. No stridor.  Musculoskeletal: Normal range of motion.  Right middle digit with tip avulsed, distal phalanx is visible +fracture, bleeding is moderate.  Neurological: She is alert and oriented to person, place, and time.  Psychiatric: She has a normal mood and affect.      ED Course  Procedures (including critical care time) Labs Review  Labs Reviewed  BASIC METABOLIC PANEL - Abnormal; Notable for the following:    Potassium 3.4 (*)    Glucose, Bld 147 (*)    All other components within normal limits  I-STAT CHEM 8, ED - Abnormal; Notable for the following:    Potassium 3.2 (*)    Glucose, Bld 147 (*)    All other components within normal limits  CBC WITH DIFFERENTIAL  SAMPLE TO BLOOD BANK    Imaging Review Dg Finger Middle Right  03/21/2014   CLINICAL DATA:  Pushing cart at work earlier today and stuck finger with metal pole. Injury to tip of finger.  EXAM: RIGHT MIDDLE FINGER 2+V  COMPARISON:  None.  FINDINGS: There is a  comminuted, displaced and likely compound fracture involving the tuft of the distal phalanx of the middle digit. No definite intra-articular extension Extensive adjacent subcutaneous stranding. No radiopaque foreign body. Joint spaces are preserved. No erosions.  IMPRESSION: Comminuted, displaced and likely compound fracture involving the tuft of the distal phalanx of the middle digit without definite intra-articular extension. No radiopaque foreign body.   Electronically Signed   By: Sandi Mariscal M.D.   On: 03/21/2014 10:05     EKG Interpretation None      MDM   Final diagnoses:  Fingertip avulsion, initial encounter    Filed Vitals:   03/21/14 0922 03/21/14 0930 03/21/14 0959 03/21/14 1030  BP: 171/97 155/100 164/78 156/77  Pulse:  97  100  Temp:      Resp: 17 25 17 18   SpO2: 99% 97% 95% 98%    Medications  morphine 4 MG/ML injection 4 mg (4 mg Intravenous Given 03/21/14 0927)  lidocaine (XYLOCAINE) 2 % (with pres) injection 400 mg (not administered)  sodium chloride 0.9 % bolus 1,000 mL (0 mLs Intravenous Stopped 03/21/14 1045)  ondansetron (ZOFRAN) injection 4 mg (4 mg Intravenous Given 03/21/14 0850)  ceFAZolin (ANCEF) IVPB 1 g/50 mL premix (0 g Intravenous Stopped 03/21/14 0941)  HYDROmorphone (DILAUDID) injection 0.5 mg (0.5 mg Intravenous Given 03/21/14 1051)  sodium chloride 0.9 % bolus 1,000 mL (1,000 mLs Intravenous New Bag/Given 03/21/14 1051)    DELORICE Cain is a 58 y.o. female presenting with right third digit fingertip avulsion, bone is exposed. Patient is given Ancef. Patient's tachycardia in the 120s to 130s. I think this is secondary to anxiety. I have offered the patient and anxiety medications but she declines. Sample sent to blood Bank and patient is bolused. She's not anticoagulated. This is not her dominant hand. Tetanus is up-to-date. Consult hand surgery after I have H&H and x-ray.  I placed the tip of the finger in a biohazard bag and said that inside a  container of ice.  Case d/w Dr. Grandville Silos who will take her to the OR.     Monico Blitz, PA-C 03/21/14 1538

## 2014-03-21 NOTE — H&P (Signed)
ORTHOPAEDIC CONSULTATION HISTORY & PHYSICAL REQUESTING PHYSICIAN: Ephraim Hamburger, MD  Chief Complaint: Right long finger tip amputation  HPI: Cheryl Cain is a 58 y.o. female whose right long fingertip became pinched between a rolling cart and a post, amputating the tip. She presented to the emergency department for further evaluation and management.  Past Medical History  Diagnosis Date  . Dysmenorrhea   . Menorrhagia   . Hypertension   . Anemia   . Diverticulitis   . Colon polyps    Past Surgical History  Procedure Laterality Date  . Tubal ligation Bilateral 1987  . Colposcopy      multiple polyps and diverticulitis  . Breast excisional biopsy Right 30's    fibroadenoma   History   Social History  . Marital Status: Married    Spouse Name: N/A    Number of Children: N/A  . Years of Education: N/A   Social History Main Topics  . Smoking status: Never Smoker   . Smokeless tobacco: Never Used  . Alcohol Use: No  . Drug Use: No  . Sexual Activity: Yes    Birth Control/ Protection: Post-menopausal   Other Topics Concern  . Not on file   Social History Narrative  . No narrative on file   Family History  Problem Relation Age of Onset  . Breast cancer Mother   . Hypertension Sister   . Cancer Paternal Uncle     prostate cancer  . Cancer Maternal Grandmother     ovarian  . Heart failure Father   . Hypertension Brother   . Hypertension Sister   . Heart failure Sister    No Known Allergies Prior to Admission medications   Medication Sig Start Date End Date Taking? Authorizing Provider  aspirin (ECOTRIN LOW STRENGTH) 81 MG EC tablet Take 81 mg by mouth daily. Swallow whole.   Yes Historical Provider, MD  lisinopril-hydrochlorothiazide (PRINZIDE,ZESTORETIC) 20-25 MG per tablet Take 1 tablet by mouth daily.   Yes Historical Provider, MD  simvastatin (ZOCOR) 40 MG tablet Take 40 mg by mouth daily.   Yes Historical Provider, MD   Dg Finger Middle  Right  03/21/2014   CLINICAL DATA:  Pushing cart at work earlier today and stuck finger with metal pole. Injury to tip of finger.  EXAM: RIGHT MIDDLE FINGER 2+V  COMPARISON:  None.  FINDINGS: There is a comminuted, displaced and likely compound fracture involving the tuft of the distal phalanx of the middle digit. No definite intra-articular extension Extensive adjacent subcutaneous stranding. No radiopaque foreign body. Joint spaces are preserved. No erosions.  IMPRESSION: Comminuted, displaced and likely compound fracture involving the tuft of the distal phalanx of the middle digit without definite intra-articular extension. No radiopaque foreign body.   Electronically Signed   By: Sandi Mariscal M.D.   On: 03/21/2014 10:05    Positive ROS: All other systems have been reviewed and were otherwise negative with the exception of those mentioned in the HPI and as above.  Physical Exam: Vitals: Refer to EMR. Constitutional:  WD, WN, NAD HEENT:  NCAT, EOMI Neuro/Psych:  Alert & oriented to person, place, and time; appropriate mood & affect Lymphatic: No generalized extremity edema or lymphadenopathy Extremities / MSK:  The extremities are normal with respect to appearance, ranges of motion, joint stability, muscle strength/tone, sensation, & perfusion except as otherwise noted:  Right long finger tip has been amputated, largely with a transverse pattern. Distal phalanx protrudes from the finger tip. There appears to  be no significant bone and the soft tissue sleeve. The nailbed yielded near the visible base of the nail.  Flexor and extensor tendons are intact.  Assessment: Right long fingertip amputation  Plan: I discussed different options for proceeding. One option includes maximum preservation of skeletal length with efforts to achieve soft tissue coverage either with local flap or possibly cross finger flap from the adjacent digit. She understands that this was a reasonable chance for success, but  in a less certain fashion and primary skeletal shortening and closure with tissues that are present. After some deliberation consideration with her family members were present, she decided upon primary skeletal shortening with direct closure. She'll be taken the operating room to proceed today.  Rayvon Char Grandville Silos, [address] [place], [place]  [address] Office: [phone] Mobile: [phone]

## 2014-03-21 NOTE — ED Notes (Signed)
MD at bedside. 

## 2014-03-21 NOTE — ED Notes (Signed)
All belongings given to pt husband

## 2014-03-21 NOTE — Anesthesia Procedure Notes (Signed)
Procedure Name: Intubation Date/Time: 03/21/2014 1:13 PM Performed by: Marinda Elk A Pre-anesthesia Checklist: Patient identified, Timeout performed, Emergency Drugs available, Suction available and Patient being monitored Patient Re-evaluated:Patient Re-evaluated prior to inductionOxygen Delivery Method: Circle system utilized Preoxygenation: Pre-oxygenation with 100% oxygen Intubation Type: IV induction, Rapid sequence and Cricoid Pressure applied Laryngoscope Size: Mac and 3 Grade View: Grade IV Tube type: Oral Tube size: 7.5 mm Number of attempts: 1 Airway Equipment and Method: Stylet Placement Confirmation: breath sounds checked- equal and bilateral and positive ETCO2 Secured at: 21 cm Tube secured with: Tape Dental Injury: Teeth and Oropharynx as per pre-operative assessment

## 2014-03-21 NOTE — Discharge Instructions (Signed)
Discharge Instructions   You have a light dressing on your hand.  You may begin gentle motion of your fingers and hand immediately, but you should not do any heavy lifting or gripping.  Elevate your hand to reduce pain & swelling of the digits.  Ice over the operative site may be helpful to reduce pain & swelling.  DO NOT USE HEAT. Pain medicine has been prescribed for you.  Use your medicine as needed over the first 48 hours, and then you can begin to taper your use. You may use Tylenol in place of your prescribed pain medication, but not IN ADDITION to it. Leave the dressing in place until you return to the office, keeping it clean and dry. After the bandage has been removed you may shower, but do not soak the incision.  You may drive a car when you are off of prescription pain medications and can safely control your vehicle with both hands. We will address whether therapy will be required or not when you return to the office.   Please call [phone] during normal business hours or [phone] after hours for any problems. Including the following:  - excessive redness of the incisions - drainage for more than 4 days - fever of more than 101.5 F  *Please note that pain medications will not be refilled after hours or on weekends.  WORK STATUS:   NO WORK UNTIL AT LEAST FIRST FOLLOW-UP APPOINTMENT IN 7-10 DAYS.

## 2014-03-21 NOTE — ED Notes (Signed)
Pt to OR via stretcher, family at bedside

## 2014-03-21 NOTE — Discharge Instructions (Signed)
Please follow directions provided. Your wound looks good. Your dressing has been reapplied. Be sure to followup as directed with Dr. Grandville Silos in 7-10 days from today call Monday morning to make this appointment. Don't hesitate to return for any redness, worsening pain, drainage, or other signs of infection.  SEEK MEDICAL CARE IF:  Your skin around the wound looks red.  Your wound feels more tender or sore.  You see pus in the wound.  Your wound smells bad.  You have a fever.  Your skin around the wound has a rash that itches and burns.  You see black or yellow skin in your wound that was not there before.  You feel nauseous, throw up, and feel very tired.

## 2014-03-21 NOTE — ED Notes (Signed)
Removed earrings and watch. placd in pt purse per her request

## 2014-03-22 NOTE — ED Provider Notes (Signed)
Medical screening examination/treatment/procedure(s) were performed by non-physician practitioner and as supervising physician I was immediately available for consultation/collaboration.   EKG Interpretation None        Ephraim Hamburger, MD 03/22/14 [phone]

## 2014-03-23 ENCOUNTER — Encounter (HOSPITAL_COMMUNITY): Payer: Self-pay | Admitting: Orthopedic Surgery

## 2014-03-26 NOTE — ED Provider Notes (Signed)
Medical screening examination/treatment/procedure(s) were performed by non-physician practitioner and as supervising physician I was immediately available for consultation/collaboration.   EKG Interpretation None        [name], MD 03/26/14 2058

## 2014-04-20 ENCOUNTER — Encounter (HOSPITAL_COMMUNITY): Payer: Self-pay | Admitting: Orthopedic Surgery

## 2015-04-01 ENCOUNTER — Ambulatory Visit (INDEPENDENT_AMBULATORY_CARE_PROVIDER_SITE_OTHER): Payer: BLUE CROSS/BLUE SHIELD | Admitting: Obstetrics and Gynecology

## 2015-04-01 ENCOUNTER — Encounter: Payer: Self-pay | Admitting: Obstetrics and Gynecology

## 2015-04-01 VITALS — BP 144/86 | HR 100 | Resp 20 | Ht 64.5 in | Wt 141.0 lb

## 2015-04-01 DIAGNOSIS — I10 Essential (primary) hypertension: Secondary | ICD-10-CM | POA: Diagnosis not present

## 2015-04-01 DIAGNOSIS — K5792 Diverticulitis of intestine, part unspecified, without perforation or abscess without bleeding: Secondary | ICD-10-CM | POA: Insufficient documentation

## 2015-04-01 DIAGNOSIS — Z01419 Encounter for gynecological examination (general) (routine) without abnormal findings: Secondary | ICD-10-CM | POA: Diagnosis not present

## 2015-04-01 DIAGNOSIS — K635 Polyp of colon: Secondary | ICD-10-CM | POA: Insufficient documentation

## 2015-04-01 NOTE — Patient Instructions (Signed)

## 2015-04-01 NOTE — Progress Notes (Signed)
59 y.o. G2P2 MarriedAfrican AmericanF here for annual exam.  No vaginal bleeding. Sexually active on occasion, no pain. Tolerable vasomotor symptoms.     Patient's last menstrual period was 10/17/2009.          Sexually active: Yes.    The current method of family planning is post menopausal status.    Exercising: Yes.    The patient has a physically strenuous job, but has no regular exercise apart from work.  Smoker:  no  Health Maintenance: Pap:  11/05/12 neg. HR HPV:neg History of abnormal Pap:  no MMG:  09/05/14 BIRADS1:Neg Colonoscopy:  2012 Polyps - Repeat 5 years  BMD:   Never TDaP:  2014    reports that she has never smoked. She has never used smokeless tobacco. She reports that she does not drink alcohol or use illicit drugs. 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 She has 2 kids, 24 and 34, 1 grandchild, 36 year old boy. Her 46 year old son is living at home, working, paying off school loans. Daughter and her family live in [place].   Past Medical History  Diagnosis Date  . Dysmenorrhea   . Menorrhagia   . Hypertension   . Anemia   . Diverticulitis   . Colon polyps     Past Surgical History  Procedure Laterality Date  . Tubal ligation Bilateral 1987  . Colposcopy      multiple polyps and diverticulitis  . Breast excisional biopsy Right 30's    fibroadenoma  . Amputation Right 03/21/2014    Procedure: RIGHT LONG FINGER AMPUTATION REVISION;  Surgeon: Jolyn Nap, MD;  Location: [facility];  Service: Orthopedics;  Laterality: Right;    Current Outpatient Prescriptions  Medication Sig Dispense Refill  . cyclobenzaprine (FLEXERIL) 10 MG tablet Take 1 tablet by mouth daily as needed.    Marland Kitchen lisinopril-hydrochlorothiazide (PRINZIDE,ZESTORETIC) 20-25 MG per tablet Take 1 tablet by mouth daily.    . Omega-3 Fatty Acids (OMEGA 3 500 PO) Take by mouth daily.    Marland Kitchen oxyCODONE-acetaminophen (ROXICET) 5-325 MG per tablet Take 1-2 tablets by mouth every 4  (four) hours as needed. 80 tablet 0  . simvastatin (ZOCOR) 40 MG tablet Take 40 mg by mouth daily.     No current facility-administered medications for this visit.    Family History  Problem Relation Age of Onset  . Breast cancer Mother   . Hypertension Sister   . Cancer Paternal Uncle     prostate cancer  . Cancer Maternal Grandmother     ovarian  . Heart failure Father   . Hypertension Brother   . Hypertension Sister   . Heart failure Sister     Review of Systems: comprehensive ROS was negative.   Exam:   BP 144/86 mmHg  Pulse 100  Resp 20  Ht 5' 4.5" (1.638 m)  Wt 141 lb (63.957 kg)  BMI 23.84 kg/m2  LMP 10/17/2009  Weight change: @WEIGHTCHANGE @ Height:   Height: 5' 4.5" (163.8 cm)  Ht Readings from Last 3 Encounters:  04/01/15 5' 4.5" (1.638 m)  03/21/14 5\' 4"  (1.626 m)  02/16/14 5' 4.75" (1.645 m)    General appearance: alert, cooperative and appears stated age Head: Normocephalic, without obvious abnormality, atraumatic Neck: no adenopathy, supple, symmetrical, trachea midline and thyroid normal to inspection and palpation Lungs: clear to auscultation bilaterally Breasts: normal appearance, no masses or tenderness Heart: regular rate and rhythm Abdomen: soft, non-tender; bowel sounds  normal; no masses,  no organomegaly Extremities: extremities normal, atraumatic, no cyanosis or edema Skin: Skin color, texture, turgor normal. No rashes or lesions Lymph nodes: Cervical, supraclavicular, and axillary nodes normal. No abnormal inguinal nodes palpated Neurologic: Grossly normal   Pelvic: External genitalia:  no lesions              Urethra:  normal appearing urethra with no masses, tenderness or lesions              Bartholins and Skenes: normal                 Vagina: normal appearing vagina with normal color and discharge, no lesions. Atrophic              Cervix: no lesions               Bimanual Exam:  Uterus:  normal size, contour, position, consistency,  mobility, non-tender              Adnexa: no mass, fullness, tenderness               Rectovaginal: Confirms               Anus:  normal sphincter tone, no lesions  Chaperone was present for exam.  A:  Well Woman with normal exam  HTN, on medication, slightly elevated today, she was anxious  P:   Labs with primary  She will monitor her BP at home and call her primary  No pap this year  Mammogram next spring  Colonoscopy next year  Discussed calcium and vit D  Discussed breast self exam

## 2015-09-18 DIAGNOSIS — Z1231 Encounter for screening mammogram for malignant neoplasm of breast: Secondary | ICD-10-CM | POA: Diagnosis not present

## 2015-09-18 DIAGNOSIS — Z803 Family history of malignant neoplasm of breast: Secondary | ICD-10-CM | POA: Diagnosis not present

## 2016-01-19 DIAGNOSIS — M545 Low back pain: Secondary | ICD-10-CM | POA: Diagnosis not present

## 2016-01-19 DIAGNOSIS — I10 Essential (primary) hypertension: Secondary | ICD-10-CM | POA: Diagnosis not present

## 2016-01-19 DIAGNOSIS — E7800 Pure hypercholesterolemia, unspecified: Secondary | ICD-10-CM | POA: Diagnosis not present

## 2016-01-19 DIAGNOSIS — E119 Type 2 diabetes mellitus without complications: Secondary | ICD-10-CM | POA: Diagnosis not present

## 2016-04-04 ENCOUNTER — Encounter: Payer: Self-pay | Admitting: Nurse Practitioner

## 2016-04-04 ENCOUNTER — Ambulatory Visit (INDEPENDENT_AMBULATORY_CARE_PROVIDER_SITE_OTHER): Payer: BLUE CROSS/BLUE SHIELD | Admitting: Nurse Practitioner

## 2016-04-04 VITALS — BP 138/76 | HR 72 | Ht 64.5 in | Wt 141.0 lb

## 2016-04-04 DIAGNOSIS — I10 Essential (primary) hypertension: Secondary | ICD-10-CM

## 2016-04-04 DIAGNOSIS — Z0000 Encounter for general adult medical examination without abnormal findings: Secondary | ICD-10-CM

## 2016-04-04 DIAGNOSIS — D259 Leiomyoma of uterus, unspecified: Secondary | ICD-10-CM

## 2016-04-04 DIAGNOSIS — Z124 Encounter for screening for malignant neoplasm of cervix: Secondary | ICD-10-CM | POA: Diagnosis not present

## 2016-04-04 DIAGNOSIS — Z1151 Encounter for screening for human papillomavirus (HPV): Secondary | ICD-10-CM | POA: Diagnosis not present

## 2016-04-04 DIAGNOSIS — Z01419 Encounter for gynecological examination (general) (routine) without abnormal findings: Secondary | ICD-10-CM | POA: Diagnosis not present

## 2016-04-04 NOTE — Patient Instructions (Addendum)

## 2016-04-04 NOTE — Progress Notes (Signed)
Patient ID: Cheryl Cain, female   DOB: [birth year], 60 y.o.   MRN: [id number]  60 y.o. G2P2002 Married  African American Fe here for annual exam.  Still some vaso symptoms that are tolerable. No vaginal dryness.  No urinary stress incontinence.  Patient's last menstrual period was 10/17/2009.          Sexually active: Yes.    The current method of family planning is post menopausal status.    Exercising: Yes.    walking Smoker:  no  Health Maintenance: Pap: 11/05/12, Negative with neg HR HPV MMG: 09/18/15, Bi-Rads 1: Negative  Colonoscopy: 2012 polyp and diverticulosis recheck in 5 years. Dr. Collene Mares  BMD: never  TDaP: 08/2012  Shingles: Not indicated due to age Pneumonia: Not indicated due to age 34 C and HIV: discuss today & will get at PCP Labs: PCP takes care of all labs   reports that she has never smoked. She has never used smokeless tobacco. She reports that she does not drink alcohol or use drugs.  Past Medical History:  Diagnosis Date  . Anemia   . Colon polyps   . Diverticulitis   . Hypercholesterolemia   . Hypertension     Past Surgical History:  Procedure Laterality Date  . AMPUTATION Right 03/21/2014   Procedure: RIGHT LONG FINGER AMPUTATION REVISION;  Surgeon: Jolyn Nap, MD;  Location: [facility];  Service: Orthopedics;  Laterality: Right;  . BREAST EXCISIONAL BIOPSY Right 30's   fibroadenoma  . COLONOSCOPY     multiple polyps and diverticulitis  . TUBAL LIGATION Bilateral 1987    Current Outpatient Prescriptions  Medication Sig Dispense Refill  . cyclobenzaprine (FLEXERIL) 10 MG tablet Take 1 tablet by mouth daily as needed.    Marland Kitchen lisinopril-hydrochlorothiazide (PRINZIDE,ZESTORETIC) 20-25 MG per tablet Take 1 tablet by mouth daily.    . Omega-3 Fatty Acids (OMEGA 3 500 PO) Take by mouth daily.    Marland Kitchen oxyCODONE-acetaminophen (ROXICET) 5-325 MG per tablet Take 1-2 tablets by mouth every 4 (four) hours as needed. 80 tablet 0  . simvastatin (ZOCOR) 40 MG  tablet Take 40 mg by mouth daily.     No current facility-administered medications for this visit.     Family History  Problem Relation Age of Onset  . Breast cancer Mother   . Hypertension Sister   . Cancer Paternal Uncle     prostate cancer  . Cancer Maternal Grandmother     ovarian  . Heart failure Father   . Hypertension Brother   . Hypertension Sister   . Heart failure Sister     ROS:  Pertinent items are noted in HPI.  Otherwise, a comprehensive ROS was negative.  Exam:   LMP 10/17/2009    Ht Readings from Last 3 Encounters:  04/01/15 5' 4.5" (1.638 m)  03/21/14 5' 4"  (1.626 m)  02/16/14 5' 4.75" (1.645 m)    General appearance: alert, cooperative and appears stated age Head: Normocephalic, without obvious abnormality, atraumatic Neck: no adenopathy, supple, symmetrical, trachea midline and thyroid normal to inspection and palpation Lungs: clear to auscultation bilaterally Breasts: normal appearance, no masses or tenderness Heart: regular rate and rhythm Abdomen: soft, non-tender; no masses,  no organomegaly Extremities: extremities normal, atraumatic, no cyanosis or edema Skin: Skin color, texture, turgor normal. No rashes or lesions Lymph nodes: Cervical, supraclavicular, and axillary nodes normal. No abnormal inguinal nodes palpated Neurologic: Grossly normal   Pelvic: External genitalia:  no lesions  Urethra:  normal appearing urethra with no masses, tenderness or lesions              Bartholin's and Skene's: normal                 Vagina: normal appearing vagina with normal color and discharge, no lesions              Cervix: anteverted              Pap taken: No. Bimanual Exam:  Uterus:  enlarged, 8 weeks size              Adnexa: no obvious mass secondary to uterine enlargement               Rectovaginal: Confirms               Anus:  normal sphincter tone, no lesions  Chaperone present: yes  A:  Well Woman with normal exam    Postmenopausal no HRT             History of OV cyst 2011 - no pain  S/P [place] 01/18/2005 with fundal fibroid at 8.7 cm             [facility] of Breast Cancer & OV cancer - declines BRCA testing   Pain left lower pelvic ? Related to fibroids   P:   Reviewed health and wellness pertinent to exam  Pap was done  Mammogram is due 09/2016  Will get PUS and / or SHGM to evaluate fibroids and pain in left lower pelvic that feels like a pinching pain in character.   Counseled on breast self exam, mammography screening, adequate intake of calcium and vitamin D, diet and exercise, Kegel's exercises return annually or prn  An After Visit Summary was printed and given to the patient.

## 2016-04-06 LAB — IPS PAP TEST WITH HPV

## 2016-04-07 ENCOUNTER — Telehealth: Payer: Self-pay | Admitting: Nurse Practitioner

## 2016-04-07 DIAGNOSIS — D259 Leiomyoma of uterus, unspecified: Secondary | ICD-10-CM

## 2016-04-07 NOTE — Telephone Encounter (Signed)
Spoke with patient regarding benefit and scheduling for sonohystogram. Patient understood and agreeable to benefit. Patient states she is at work and needs to return call Monday to discuss scheduling with Dr Quincy Simmonds.

## 2016-04-07 NOTE — Telephone Encounter (Signed)
Called patient to review benefits for a recommended procedure. Left Voicemail requesting a call back. °

## 2016-04-09 NOTE — Progress Notes (Signed)
Encounter reviewed by Dr. Dontee Jaso Amundson C. Silva.  

## 2016-04-10 NOTE — Telephone Encounter (Signed)
Called patient to review benefits for a recommended procedure. Left Voicemail requesting a call back. °

## 2016-04-10 NOTE — Telephone Encounter (Signed)
Patient only needs ultrasound.  She does not need a sonohysterogram.

## 2016-04-10 NOTE — Telephone Encounter (Signed)
Patient returned call. Patient is ready to proceed with scheduling recommended sonohysterogram.  Patient is scheduled 04/27/16 with Dr Quincy Simmonds. Patient was offered earlier appointment dates, but she declined, due to her schedule. Patient is aware of date, time and cancellation policy. Patient had no further questions.  Routing to [place]  cc: Dr Quincy Simmonds

## 2016-04-11 NOTE — Telephone Encounter (Signed)
Appointment for sonohysterogram has been cancelled and rescheduled correctly, to reflect an ultrasound, as noted in previous phone encounter from Dr Quincy Simmonds. Appointment date and arrival time will remain the same. Ok to close

## 2016-04-21 ENCOUNTER — Telehealth: Payer: Self-pay | Admitting: Obstetrics and Gynecology

## 2016-04-21 NOTE — Telephone Encounter (Signed)
Please communicate to the patient.  Ok to cancel ultrasound appointment.  The indication was really pain and not bleeding.  She has known fibroids.  Patient may reschedule as she wishes.

## 2016-04-21 NOTE — Telephone Encounter (Signed)
Patient has canceled her appointment due to her job closing. She wants t reschedule this appointment but states it will probably be a couple of months before she will be able to come in.

## 2016-04-24 NOTE — Telephone Encounter (Signed)
Left message to call Cheryl Cain at [phone].  

## 2016-04-24 NOTE — Telephone Encounter (Signed)
Returned call to patient to reschedule PUS. Patient states unable to reschedule at this time due to not knowing job status; job closes next month. Patient states she has job fairs and workshops next week. Recommended following up to reschedule PUS -call return call at [phone]. Patient verbalizes understanding and is agreeable.   Dr. Quincy Simmonds, any additional recommendations? OK to close encounter?

## 2016-04-24 NOTE — Telephone Encounter (Signed)
Ok to close the encounter.

## 2016-04-27 ENCOUNTER — Other Ambulatory Visit: Payer: BLUE CROSS/BLUE SHIELD

## 2016-04-27 ENCOUNTER — Other Ambulatory Visit: Payer: BLUE CROSS/BLUE SHIELD | Admitting: Obstetrics and Gynecology

## 2016-07-21 DIAGNOSIS — E7800 Pure hypercholesterolemia, unspecified: Secondary | ICD-10-CM | POA: Diagnosis not present

## 2016-07-21 DIAGNOSIS — I10 Essential (primary) hypertension: Secondary | ICD-10-CM | POA: Diagnosis not present

## 2016-07-21 DIAGNOSIS — M545 Low back pain: Secondary | ICD-10-CM | POA: Diagnosis not present

## 2016-07-21 DIAGNOSIS — E119 Type 2 diabetes mellitus without complications: Secondary | ICD-10-CM | POA: Diagnosis not present

## 2017-01-08 ENCOUNTER — Telehealth: Payer: Self-pay | Admitting: Obstetrics and Gynecology

## 2017-01-08 NOTE — Telephone Encounter (Signed)
Left patient a message to call back to reschedule a future appointment that was cancelled by the provider for AEX. °

## 2017-03-27 DIAGNOSIS — E7800 Pure hypercholesterolemia, unspecified: Secondary | ICD-10-CM | POA: Diagnosis not present

## 2017-03-27 DIAGNOSIS — I10 Essential (primary) hypertension: Secondary | ICD-10-CM | POA: Diagnosis not present

## 2017-03-27 DIAGNOSIS — E119 Type 2 diabetes mellitus without complications: Secondary | ICD-10-CM | POA: Diagnosis not present

## 2017-03-27 DIAGNOSIS — Z23 Encounter for immunization: Secondary | ICD-10-CM | POA: Diagnosis not present

## 2017-04-05 ENCOUNTER — Ambulatory Visit: Payer: BLUE CROSS/BLUE SHIELD | Admitting: Certified Nurse Midwife

## 2017-04-09 ENCOUNTER — Ambulatory Visit: Payer: BLUE CROSS/BLUE SHIELD | Admitting: Nurse Practitioner

## 2017-04-24 ENCOUNTER — Encounter: Payer: Self-pay | Admitting: Certified Nurse Midwife

## 2017-04-24 ENCOUNTER — Ambulatory Visit: Payer: BLUE CROSS/BLUE SHIELD | Admitting: Certified Nurse Midwife

## 2017-04-24 VITALS — BP 126/62 | HR 68 | Resp 16 | Ht 64.5 in | Wt 139.0 lb

## 2017-04-24 DIAGNOSIS — N951 Menopausal and female climacteric states: Secondary | ICD-10-CM

## 2017-04-24 DIAGNOSIS — Z01419 Encounter for gynecological examination (general) (routine) without abnormal findings: Secondary | ICD-10-CM | POA: Diagnosis not present

## 2017-04-24 DIAGNOSIS — Z8639 Personal history of other endocrine, nutritional and metabolic disease: Secondary | ICD-10-CM | POA: Diagnosis not present

## 2017-04-24 NOTE — Progress Notes (Signed)
61 y.o. [id number] Married  African American Fe here for annual exam. Menopausal no HRT. Denies vaginal bleeding.Occasional vaginal dryness using lubricant for sexual activity. Had to change jobs due to company closing, so working two jobs now, very busy. Spouse also lost job with [employer], but has found employment. Sees [facility] for aex and labs and medication management of recently diagnosed type 2 diabetes/hypertesion and cholesterol management. Adjusting to Glucophage and diet change. Trying to do regular breast exams now. No other health issues today.   Patient's last menstrual period was 10/17/2009 (approximate).          Sexually active: Yes.    The current method of family planning is post menopausal status.    Exercising: No.  exercise Smoker:  no  Health Maintenance: Pap:  11-05-12 neg HPV HR neg, 04-04-16 neg HPV HR neg History of Abnormal Pap: no MMG:  09-18-15 category c density birads 1:neg has appt. 05/21/17 Self Breast exams: occ Colonoscopy:  2012 polyp & diverticulosis/diverticulitis? f/u 60yrs . With Dr. Glendora Score. Plans to schedule. BMD:   none TDaP:  2014 Shingles: no Pneumonia: no Hep C and HIV: not done Labs:    reports that she has quit smoking. she has never used smokeless tobacco. She reports that she does not drink alcohol or use drugs.  Past Medical History:  Diagnosis Date  . Anemia   . Colon polyps   . Diabetes mellitus without complication ([place])   . Diverticulitis   . Hypercholesterolemia   . Hypertension     Past Surgical History:  Procedure Laterality Date  . BREAST EXCISIONAL BIOPSY Right 30's   fibroadenoma  . COLONOSCOPY     multiple polyps and diverticulitis  . TUBAL LIGATION Bilateral 1987    Current Outpatient Medications  Medication Sig Dispense Refill  . cyclobenzaprine (FLEXERIL) 10 MG tablet Take 1 tablet by mouth daily as needed.    Marland Kitchen lisinopril-hydrochlorothiazide (PRINZIDE,ZESTORETIC) 20-25 MG per tablet Take 1 tablet by mouth daily.     . metFORMIN (GLUCOPHAGE) 500 MG tablet 1 TABLET WITH A MEAL TWICE A DAY ORALLY 30 DAY(S)  5  . Omega-3 Fatty Acids (OMEGA 3 500 PO) Take by mouth daily.    . simvastatin (ZOCOR) 40 MG tablet Take 40 mg by mouth daily.     No current facility-administered medications for this visit.     Family History  Problem Relation Age of Onset  . Breast cancer Mother   . Hypertension Sister   . Heart failure Father   . Hypertension Brother   . Hypertension Sister   . Heart failure Sister   . Cancer Paternal Uncle        prostate cancer  . Cancer Maternal Grandmother        ovarian    ROS:  Pertinent items are noted in HPI.  Otherwise, a comprehensive ROS was negative.  Exam:   BP 126/62   Pulse 68   Resp 16   Ht 5' 4.5" (1.638 m)   Wt 139 lb (63 kg)   LMP 10/17/2009 (Approximate)   BMI 23.49 kg/m  Height: 5' 4.5" (163.8 cm) Ht Readings from Last 3 Encounters:  04/24/17 5' 4.5" (1.638 m)  04/04/16 5' 4.5" (1.638 m)  04/01/15 5' 4.5" (1.638 m)    General appearance: alert, cooperative and appears stated age Head: Normocephalic, without obvious abnormality, atraumatic Neck: no adenopathy, supple, symmetrical, trachea midline and thyroid normal to inspection and palpation Lungs: clear to auscultation bilaterally Breasts: normal appearance,  no masses or tenderness, No nipple retraction or dimpling, No nipple discharge or bleeding, No axillary or supraclavicular adenopathy Heart: regular rate and rhythm Abdomen: soft, non-tender; no masses,  no organomegaly Extremities: extremities normal, atraumatic, no cyanosis or edema Skin: Skin color, texture, turgor normal. No rashes or lesions Lymph nodes: Cervical, supraclavicular, and axillary nodes normal. No abnormal inguinal nodes palpated Neurologic: Grossly normal   Pelvic: External genitalia:  no lesions              Urethra:  normal appearing urethra with no masses, tenderness or lesions              Bartholin's and Skene's:  normal                 Vagina: normal appearing vagina with normal color and discharge, no lesions              Cervix: no cervical motion tenderness, no lesions and normal appearance              Pap taken: No. Bimanual Exam:  Uterus:  normal size, contour, position, consistency, mobility, non-tender              Adnexa: normal adnexa and no mass, fullness, tenderness               Rectovaginal: Confirms               Anus:  normal sphincter tone, no lesions  Chaperone present: yes  A:  Well Woman with normal exam  Menopausal no HRT  Vaginal dryness, but using OTC moisturizer with good results  Hypertension /Diabetes Type 2/Cholesterol management with MD  Colonoscopy  Due patient to schedule  P:   Reviewed health and wellness pertinent to exam  Aware of need to evaluate if vaginal bleeding  Will advise if has problems with dryness  Continue follow up with PCP as indicated  Pap smear: no    counseled on breast self exam, mammography screening, adequate intake of calcium and vitamin D, diet and exercise and stressed importance of scheduling colonoscopy  return annually or prn  An After Visit Summary was printed and given to the patient.

## 2017-04-24 NOTE — Patient Instructions (Signed)

## 2017-10-03 DIAGNOSIS — E7800 Pure hypercholesterolemia, unspecified: Secondary | ICD-10-CM | POA: Diagnosis not present

## 2017-10-03 DIAGNOSIS — E119 Type 2 diabetes mellitus without complications: Secondary | ICD-10-CM | POA: Diagnosis not present

## 2017-10-03 DIAGNOSIS — M545 Low back pain: Secondary | ICD-10-CM | POA: Diagnosis not present

## 2017-10-03 DIAGNOSIS — I10 Essential (primary) hypertension: Secondary | ICD-10-CM | POA: Diagnosis not present

## 2017-10-20 DIAGNOSIS — Z1231 Encounter for screening mammogram for malignant neoplasm of breast: Secondary | ICD-10-CM | POA: Diagnosis not present

## 2017-11-05 ENCOUNTER — Encounter: Payer: Self-pay | Admitting: Certified Nurse Midwife

## 2018-04-08 DIAGNOSIS — M545 Low back pain: Secondary | ICD-10-CM | POA: Diagnosis not present

## 2018-04-08 DIAGNOSIS — E119 Type 2 diabetes mellitus without complications: Secondary | ICD-10-CM | POA: Diagnosis not present

## 2018-04-08 DIAGNOSIS — I10 Essential (primary) hypertension: Secondary | ICD-10-CM | POA: Diagnosis not present

## 2018-04-08 DIAGNOSIS — Z23 Encounter for immunization: Secondary | ICD-10-CM | POA: Diagnosis not present

## 2018-04-08 DIAGNOSIS — E7800 Pure hypercholesterolemia, unspecified: Secondary | ICD-10-CM | POA: Diagnosis not present

## 2018-04-24 NOTE — Progress Notes (Signed)
62 y.o. [id number] Married  Caucasian Fe here for annual exam. Menopausal still occasional hot flash, but no night sweats.Denies vaginal bleeding or vaginal dryness issues. Occasional left side discomfort and feels maybe fibroids, but had embolization and were very small at that time. Occasional constipation, no concerns. Sees [facility], Dr. Kenton Kingfisher for hypertension,diabetes, cholesterol management, labs, all stable. Working Biochemist, clinical and part-time job. No other health issues.  Patient's last menstrual period was 10/17/2009 (approximate).          Sexually active: Yes.    The current method of family planning is post menopausal status.    Exercising: Yes.    walking Smoker:  no  Review of Systems  Constitutional: Negative.   HENT: Negative.   Eyes: Negative.   Respiratory: Negative.   Cardiovascular: Negative.   Gastrointestinal: Negative.   Genitourinary: Negative.   Musculoskeletal: Negative.   Skin: Negative.   Neurological: Negative.   Endo/Heme/Allergies: Negative.   Psychiatric/Behavioral: Negative.     Health Maintenance: Pap:  04-04-16 neg HPV HR neg History of Abnormal Pap: yes MMG:  10-20-17 category c density birads 1:neg Self Breast exams: yes Colonoscopy:  2012 f/u 75yrs, not done BMD:   none TDaP:  2014 Shingles: not done Pneumonia: not done Hep C and HIV: not done Labs: PCP   reports that she has quit smoking. She has never used smokeless tobacco. She reports that she does not drink alcohol or use drugs.  Past Medical History:  Diagnosis Date  . Abnormal Pap smear of cervix    over 74yrs ago, just a repeat  . Anemia   . Colon polyps   . Diabetes mellitus without complication ([place])   . Diverticulitis   . Hypercholesterolemia   . Hypertension     Past Surgical History:  Procedure Laterality Date  . AMPUTATION Right 03/21/2014   Procedure: RIGHT LONG FINGER AMPUTATION REVISION;  Surgeon: Jolyn Nap, MD;  Location: [facility];  Service: Orthopedics;  Laterality:  Right;  . BREAST EXCISIONAL BIOPSY Right 30's   fibroadenoma  . COLONOSCOPY     multiple polyps and diverticulitis  . TUBAL LIGATION Bilateral 1987    Current Outpatient Medications  Medication Sig Dispense Refill  . cyclobenzaprine (FLEXERIL) 10 MG tablet Take 1 tablet by mouth daily as needed.    Marland Kitchen HYDROcodone-acetaminophen (NORCO) 10-325 MG tablet TAKE 1 TABLET AS NEEDED EVERY 6 HRS BY MOUTH (30 DAY SUPPLY)  0  . hydrOXYzine (ATARAX/VISTARIL) 25 MG tablet TAKE 1 TABLET AS NEEDED AT BEDTIME FOR SLEEP ORALLY 30 DAY(S)  3  . lisinopril-hydrochlorothiazide (PRINZIDE,ZESTORETIC) 20-25 MG per tablet Take 1 tablet by mouth daily.    . metFORMIN (GLUCOPHAGE) 500 MG tablet 1 TABLET WITH A MEAL TWICE A DAY ORALLY 30 DAY(S)  5  . Omega-3 Fatty Acids (OMEGA 3 500 PO) Take by mouth daily.    . simvastatin (ZOCOR) 40 MG tablet Take 40 mg by mouth daily.     No current facility-administered medications for this visit.     Family History  Problem Relation Age of Onset  . Breast cancer Mother   . Hypertension Sister   . Heart failure Father   . Hypertension Brother   . Hypertension Sister   . Heart failure Sister   . Cancer Paternal Uncle        prostate cancer  . Cancer Maternal Grandmother        ovarian    ROS:  Pertinent items are noted in HPI.  Otherwise, a comprehensive  ROS was negative.  Exam:   BP 120/70   Pulse 72   Resp 16   Ht 5' 4.25" (1.632 m)   Wt 136 lb (61.7 kg)   LMP 10/17/2009 (Approximate)   BMI 23.16 kg/m  Height: 5' 4.25" (163.2 cm) Ht Readings from Last 3 Encounters:  04/25/18 5' 4.25" (1.632 m)  04/24/17 5' 4.5" (1.638 m)  04/04/16 5' 4.5" (1.638 m)    General appearance: alert, cooperative and appears stated age Head: Normocephalic, without obvious abnormality, atraumatic Neck: no adenopathy, supple, symmetrical, trachea midline and thyroid normal to inspection and palpation Lungs: clear to auscultation bilaterally Breasts: normal appearance, no  masses or tenderness, No nipple retraction or dimpling, No nipple discharge or bleeding, No axillary or supraclavicular adenopathy Heart: regular rate and rhythm Abdomen: soft, non-tender; no masses,  no organomegaly Extremities: extremities normal, atraumatic, no cyanosis or edema Skin: Skin color, texture, turgor normal. No rashes or lesions Lymph nodes: Cervical, supraclavicular, and axillary nodes normal. No abnormal inguinal nodes palpated Neurologic: Grossly normal   Pelvic: External genitalia:  no lesions, normal female              Urethra:  normal appearing urethra with no masses, tenderness or lesions              Bartholin's and Skene's: normal                 Vagina: normal appearing vagina with normal color and discharge, no lesions              Cervix: multiparous appearance, no cervical motion tenderness and no lesions              Pap taken: No. Bimanual Exam:  Uterus:  normal size, contour, position, consistency, mobility, non-tender  No rebound or tenderness or mass noted on left              Adnexa: normal adnexa and no mass, fullness, tenderness               Rectovaginal: Confirms               Anus:  normal sphincter tone, no lesions  Chaperone present: yes  A:  Well Woman with normal exam   Menopausal no HRT  History of uterine embolization for fibroids, no enlargement palpated  Glucose, Hypertension/cholesterol with PCP management all stable per patient  BMD due  Colonoscopy over due  P:   Reviewed health and wellness pertinent to exam  Aware of need to advise if vaginal bleeding  Discussed uterine size does not palpate enlarged. No mass noted on left.  Continue follow up with PCP as indicated  Discussed need for BMD and can schedule with next mammogram. Order faxed to [facility]  Stressed importance of colonoscopy follow up , patient declines help with scheduling.  Pap smear: no   counseled on breast self exam, mammography screening, feminine hygiene,  osteoporosis, adequate intake of calcium and vitamin D, diet and exercise  return annually or prn  An After Visit Summary was printed and given to the patient.

## 2018-04-25 ENCOUNTER — Ambulatory Visit (INDEPENDENT_AMBULATORY_CARE_PROVIDER_SITE_OTHER): Payer: BLUE CROSS/BLUE SHIELD | Admitting: Certified Nurse Midwife

## 2018-04-25 ENCOUNTER — Encounter: Payer: Self-pay | Admitting: Certified Nurse Midwife

## 2018-04-25 ENCOUNTER — Other Ambulatory Visit: Payer: Self-pay

## 2018-04-25 VITALS — BP 120/70 | HR 72 | Resp 16 | Ht 64.25 in | Wt 136.0 lb

## 2018-04-25 DIAGNOSIS — N951 Menopausal and female climacteric states: Secondary | ICD-10-CM

## 2018-04-25 DIAGNOSIS — Z01419 Encounter for gynecological examination (general) (routine) without abnormal findings: Secondary | ICD-10-CM

## 2018-04-25 NOTE — Patient Instructions (Signed)

## 2018-10-16 DIAGNOSIS — E119 Type 2 diabetes mellitus without complications: Secondary | ICD-10-CM | POA: Diagnosis not present

## 2018-10-16 DIAGNOSIS — M545 Low back pain: Secondary | ICD-10-CM | POA: Diagnosis not present

## 2018-10-16 DIAGNOSIS — E7800 Pure hypercholesterolemia, unspecified: Secondary | ICD-10-CM | POA: Diagnosis not present

## 2018-10-16 DIAGNOSIS — I10 Essential (primary) hypertension: Secondary | ICD-10-CM | POA: Diagnosis not present

## 2019-04-18 DIAGNOSIS — Z23 Encounter for immunization: Secondary | ICD-10-CM | POA: Diagnosis not present

## 2019-04-18 DIAGNOSIS — I10 Essential (primary) hypertension: Secondary | ICD-10-CM | POA: Diagnosis not present

## 2019-04-18 DIAGNOSIS — M545 Low back pain: Secondary | ICD-10-CM | POA: Diagnosis not present

## 2019-04-18 DIAGNOSIS — Z7984 Long term (current) use of oral hypoglycemic drugs: Secondary | ICD-10-CM | POA: Diagnosis not present

## 2019-04-18 DIAGNOSIS — E7800 Pure hypercholesterolemia, unspecified: Secondary | ICD-10-CM | POA: Diagnosis not present

## 2019-04-18 DIAGNOSIS — E119 Type 2 diabetes mellitus without complications: Secondary | ICD-10-CM | POA: Diagnosis not present

## 2019-05-01 ENCOUNTER — Ambulatory Visit: Payer: BLUE CROSS/BLUE SHIELD | Admitting: Certified Nurse Midwife

## 2019-05-13 ENCOUNTER — Other Ambulatory Visit: Payer: Self-pay

## 2019-05-19 ENCOUNTER — Encounter: Payer: Self-pay | Admitting: Certified Nurse Midwife

## 2019-05-19 ENCOUNTER — Ambulatory Visit (INDEPENDENT_AMBULATORY_CARE_PROVIDER_SITE_OTHER): Payer: BC Managed Care – PPO | Admitting: Certified Nurse Midwife

## 2019-05-19 ENCOUNTER — Other Ambulatory Visit (HOSPITAL_COMMUNITY)
Admission: RE | Admit: 2019-05-19 | Discharge: 2019-05-19 | Disposition: A | Discharge status: Home / Self Care | Payer: BC Managed Care – PPO | Source: Ambulatory Visit | Attending: Certified Nurse Midwife | Admitting: Certified Nurse Midwife

## 2019-05-19 ENCOUNTER — Other Ambulatory Visit: Payer: Self-pay

## 2019-05-19 VITALS — BP 120/80 | HR 68 | Temp 97.1°F | Resp 16 | Ht 64.25 in | Wt 140.0 lb

## 2019-05-19 DIAGNOSIS — Z01419 Encounter for gynecological examination (general) (routine) without abnormal findings: Secondary | ICD-10-CM | POA: Diagnosis not present

## 2019-05-19 DIAGNOSIS — Z124 Encounter for screening for malignant neoplasm of cervix: Secondary | ICD-10-CM

## 2019-05-19 NOTE — Progress Notes (Signed)
63 y.o. G2P2002 Married  African American Fe here for annual exam. Post Menopausal denies vaginal bleeding or dryness. Sees Dr. Kenton Kingfisher PCP for aex and labs and medication management of hypertension,lipid, and glucose management all stable. Patient has been trying to gain some weight and now up 4 pounds, feels better. Daughter and grandchild still living with her. Aware she needs to do her colonoscopy, denies any blood in stool or black tarry stools. No other health concerns today.  Patient's last menstrual period was 10/17/2009 (approximate).          Sexually active: Yes.    The current method of family planning is post menopausal status.    Exercising: No.  exercise Smoker:  no  Review of Systems  Constitutional: Negative.   HENT: Negative.   Eyes: Negative.   Respiratory: Negative.   Cardiovascular: Negative.   Gastrointestinal: Negative.   Genitourinary: Negative.   Musculoskeletal: Negative.   Skin: Negative.   Neurological: Negative.   Endo/Heme/Allergies: Negative.   Psychiatric/Behavioral: Negative.     Health Maintenance: Pap:  04-04-16 neg HPV HR neg History of Abnormal Pap: yes MMG:  10-20-17 category c density birads 1:neg Self Breast exams: yes Colonoscopy:  2012 f/u 76yrs, not done, plans in January. BMD:   None  TDaP:  2014 Shingles: not done Pneumonia: not done Hep C and HIV: not done Labs: no    reports that she has quit smoking. She has never used smokeless tobacco. She reports that she does not drink alcohol or use drugs.  Past Medical History:  Diagnosis Date  . Abnormal Pap smear of cervix    over 42yrs ago, just a repeat  . Anemia   . Colon polyps   . Diabetes mellitus without complication ([place])   . Diverticulitis   . Hypercholesterolemia   . Hypertension     Past Surgical History:  Procedure Laterality Date  . AMPUTATION Right 03/21/2014   Procedure: RIGHT LONG FINGER AMPUTATION REVISION;  Surgeon: Jolyn Nap, MD;  Location: [facility];   Service: Orthopedics;  Laterality: Right;  . BREAST EXCISIONAL BIOPSY Right 30's   fibroadenoma  . COLONOSCOPY     multiple polyps and diverticulitis  . TUBAL LIGATION Bilateral 1987    Current Outpatient Medications  Medication Sig Dispense Refill  . cyclobenzaprine (FLEXERIL) 10 MG tablet Take 1 tablet by mouth daily as needed.    Marland Kitchen HYDROcodone-acetaminophen (NORCO) 10-325 MG tablet TAKE 1 TABLET AS NEEDED EVERY 6 HRS BY MOUTH (30 DAY SUPPLY)  0  . hydrOXYzine (ATARAX/VISTARIL) 25 MG tablet TAKE 1 TABLET AS NEEDED AT BEDTIME FOR SLEEP ORALLY 30 DAY(S)  3  . lisinopril-hydrochlorothiazide (PRINZIDE,ZESTORETIC) 20-25 MG per tablet Take 1 tablet by mouth daily.    . metFORMIN (GLUCOPHAGE) 500 MG tablet 1 TABLET WITH A MEAL TWICE A DAY ORALLY 30 DAY(S)  5  . Omega-3 Fatty Acids (OMEGA 3 500 PO) Take by mouth daily.    . simvastatin (ZOCOR) 40 MG tablet Take 40 mg by mouth daily.     No current facility-administered medications for this visit.     Family History  Problem Relation Age of Onset  . Breast cancer Mother   . Hypertension Sister   . Heart failure Father   . Hypertension Brother   . Hypertension Sister   . Heart failure Sister   . Cancer Paternal Uncle        prostate cancer  . Cancer Maternal Grandmother        ovarian  ROS:  Pertinent items are noted in HPI.  Otherwise, a comprehensive ROS was negative.  Exam:   LMP 10/17/2009 (Approximate)    Ht Readings from Last 3 Encounters:  04/25/18 5' 4.25" (1.632 m)  04/24/17 5' 4.5" (1.638 m)  04/04/16 5' 4.5" (1.638 m)    General appearance: alert, cooperative and appears stated age Head: Normocephalic, without obvious abnormality, atraumatic Neck: no adenopathy, supple, symmetrical, trachea midline and thyroid normal to inspection and palpation Lungs: clear to auscultation bilaterally Breasts: normal appearance, no masses or tenderness, No nipple retraction or dimpling, No nipple discharge or bleeding, No  axillary or supraclavicular adenopathy, Normal to palpation without dominant masses Heart: regular rate and rhythm Abdomen: soft, non-tender; no masses,  no organomegaly Extremities: extremities normal, atraumatic, no cyanosis or edema Skin: Skin color, texture, turgor normal. No rashes or lesions Lymph nodes: Cervical, supraclavicular, and axillary nodes normal. No abnormal inguinal nodes palpated Neurologic: Grossly normal   Pelvic: External genitalia:  no lesions              Urethra:  normal appearing urethra with no masses, tenderness or lesions              Bartholin's and Skene's: normal                 Vagina: normal appearing vagina with normal color and discharge, no lesions              Cervix: no cervical motion tenderness, no lesions and normal appearance              Pap taken: Yes.   Bimanual Exam:  Uterus:  normal size, contour, position, consistency, mobility, non-tender and anteverted              Adnexa: normal adnexa and no mass, fullness, tenderness               Rectovaginal: Confirms               Anus:  normal sphincter tone, no lesions  Chaperone present: yes  A:  Well Woman with normal exam  Post menopausal no HRT  History of Uterine embolization for fibroid 2006, no size change since last exam  Hypertension/cholesterol, glucose management with PCP  Bone density and mammogram due  Colonoscopy over due, patient aware  Family history of breast (mother 38) ovarian (mgm 82)  P:   Reviewed health and wellness pertinent to exam  Discussed need to advise if vaginal bleeding  Discussed no size change in uterus today   Continue follow up with PCP as indicated  Patient to schedule mammogram, BMD order faxed and patient to schedule together  Stressed importance of colonoscopy in prevention of colon cancer, patient declines referral, she can call Dr. Collene Mares office, seen there before  Offered genetic counseling again, patient declined.  Pap smear: yes   counseled on  breast self exam, mammography screening, adequate intake of calcium and vitamin D, diet and exercise  return annually or prn  An After Visit Summary was printed and given to the patient.

## 2019-05-20 LAB — CYTOLOGY - PAP: Diagnosis: NEGATIVE

## 2019-05-29 DIAGNOSIS — Z1231 Encounter for screening mammogram for malignant neoplasm of breast: Secondary | ICD-10-CM | POA: Diagnosis not present

## 2019-05-29 DIAGNOSIS — Z780 Asymptomatic menopausal state: Secondary | ICD-10-CM | POA: Diagnosis not present

## 2019-05-29 DIAGNOSIS — Z803 Family history of malignant neoplasm of breast: Secondary | ICD-10-CM | POA: Diagnosis not present

## 2019-05-29 DIAGNOSIS — E2839 Other primary ovarian failure: Secondary | ICD-10-CM | POA: Diagnosis not present

## 2019-05-29 DIAGNOSIS — E119 Type 2 diabetes mellitus without complications: Secondary | ICD-10-CM | POA: Diagnosis not present

## 2019-06-03 ENCOUNTER — Telehealth: Payer: Self-pay

## 2019-06-03 NOTE — Telephone Encounter (Signed)
Patient notified that bone density from 05-29-2019 is in normal range. See scanned in result.

## 2019-07-30 DIAGNOSIS — Z7984 Long term (current) use of oral hypoglycemic drugs: Secondary | ICD-10-CM | POA: Diagnosis not present

## 2019-07-30 DIAGNOSIS — E119 Type 2 diabetes mellitus without complications: Secondary | ICD-10-CM | POA: Diagnosis not present

## 2019-09-10 ENCOUNTER — Encounter: Payer: Self-pay | Admitting: Certified Nurse Midwife

## 2019-10-17 DIAGNOSIS — E7800 Pure hypercholesterolemia, unspecified: Secondary | ICD-10-CM | POA: Diagnosis not present

## 2019-10-17 DIAGNOSIS — M545 Low back pain: Secondary | ICD-10-CM | POA: Diagnosis not present

## 2019-10-17 DIAGNOSIS — I10 Essential (primary) hypertension: Secondary | ICD-10-CM | POA: Diagnosis not present

## 2019-10-17 DIAGNOSIS — E119 Type 2 diabetes mellitus without complications: Secondary | ICD-10-CM | POA: Diagnosis not present

## 2019-10-31 ENCOUNTER — Encounter: Payer: Self-pay | Admitting: Family Medicine

## 2019-11-24 ENCOUNTER — Telehealth: Payer: Self-pay | Admitting: *Deleted

## 2019-11-24 DIAGNOSIS — R102 Pelvic and perineal pain: Secondary | ICD-10-CM

## 2019-11-24 DIAGNOSIS — Z86018 Personal history of other benign neoplasm: Secondary | ICD-10-CM

## 2019-11-24 NOTE — Telephone Encounter (Signed)
Patient is having left lower abdomen pain.

## 2019-11-24 NOTE — Telephone Encounter (Signed)
Left message to call Sharee Pimple, RN at [facility].   LAst AEX 05/19/19 w/ Melvia Heaps, CNM

## 2019-11-25 NOTE — Telephone Encounter (Signed)
Spoke with patient. Patient reports intermittent LLQ pain that last for 3-4 days, has been present for 1 yr. Patient reports pain started on 6/4, has decreased over the past few days, denies pain at this time. Patient reports urinary frequency, voids normal amounts, drinks a lot of water. Denies flank pain, dysuria, hematuria, vag bleeding, vag d/c, odor, N/V, fever/chills. Reports normal BMs, last BM 6/8. Patient reports abdomen is soft, non distended. Hx of BTL and uterine embolization in 2006 for fibroids.   Last AEX w/ Melvia Heaps, CNM on 05/19/19.   Offered OV today at 10am with Dr. Quincy Simmonds, patient declined due to her work schedule.   PUS scheduled for 6/17 at 10am, consult to follow at 10:30am with Dr. Quincy Simmonds.   Advised patient to return call to office if new symptoms develop or symptoms worsen, ER precautions reviewed for after hours or weekend. Advised patient I will forward for Dr. Quincy Simmonds to review, our office will f/u if any additional recommendations. Patient agreeable.   Dr. Quincy Simmonds -please review and advise.

## 2019-11-25 NOTE — Telephone Encounter (Signed)
I agree with pelvic ultrasound and appointment with me.

## 2019-11-25 NOTE — Telephone Encounter (Signed)
Order placed for PUS.   Routing to [employer] for [employer].   Encounter closed.

## 2019-11-27 ENCOUNTER — Telehealth: Payer: Self-pay | Admitting: Obstetrics and Gynecology

## 2019-11-27 NOTE — Telephone Encounter (Signed)
Spoke with patient regarding benefits for recommended ultrasound. Patient is aware that ultrasound is transvaginal. Patient acknowledges understanding of information presented. Patient is aware of cancellation policy. Encounter closed.

## 2019-12-03 ENCOUNTER — Other Ambulatory Visit: Payer: Self-pay

## 2019-12-04 ENCOUNTER — Encounter: Payer: Self-pay | Admitting: Obstetrics and Gynecology

## 2019-12-04 ENCOUNTER — Ambulatory Visit: Payer: BC Managed Care – PPO | Admitting: Obstetrics and Gynecology

## 2019-12-04 ENCOUNTER — Ambulatory Visit (INDEPENDENT_AMBULATORY_CARE_PROVIDER_SITE_OTHER): Payer: BC Managed Care – PPO

## 2019-12-04 VITALS — BP 150/86 | HR 84 | Temp 97.1°F | Ht 64.25 in | Wt 140.0 lb

## 2019-12-04 DIAGNOSIS — Z803 Family history of malignant neoplasm of breast: Secondary | ICD-10-CM | POA: Diagnosis not present

## 2019-12-04 DIAGNOSIS — R1032 Left lower quadrant pain: Secondary | ICD-10-CM

## 2019-12-04 DIAGNOSIS — R102 Pelvic and perineal pain: Secondary | ICD-10-CM | POA: Diagnosis not present

## 2019-12-04 DIAGNOSIS — Z8041 Family history of malignant neoplasm of ovary: Secondary | ICD-10-CM

## 2019-12-04 DIAGNOSIS — G8929 Other chronic pain: Secondary | ICD-10-CM

## 2019-12-04 DIAGNOSIS — Z86018 Personal history of other benign neoplasm: Secondary | ICD-10-CM

## 2019-12-04 DIAGNOSIS — D219 Benign neoplasm of connective and other soft tissue, unspecified: Secondary | ICD-10-CM | POA: Diagnosis not present

## 2019-12-04 MED ORDER — IBUPROFEN 800 MG PO TABS: 800.0000 mg | ORAL_TABLET | Freq: Three times a day (TID) | ORAL | 0 refills | Status: DC | PRN

## 2019-12-04 NOTE — Progress Notes (Signed)
GYNECOLOGY  VISIT   HPI: 64 y.o.   Married  [place] American  female   [phone] with Patient's last menstrual period was 10/17/2009 (approximate).   here for pelvic ultrasound for LLQ pain for 1 year.   States she has pain in her LLQ for 3 -4 days almost every month.  States it hurts and if she holds her side, it improves.  Tylenol and Ibuprofen do not help.  Not associated with movement and activity.  No diarrhea.  Daily BM.  Denies blood in the stool or black tarry stools.  Denies dysuria or hematuria.   Denies vaginal and spotting.   Hx BTL and uterine artery embolization for fibroids in 2006.   Hx diverticulitis.   Not sexually active.   Some strenuous lifting at work.  She has chronic back pain.  She takes hydrocodone for her back.   MGM had ovarian cancer. Mother had breast cancer.  No genetic testing done.   GYNECOLOGIC HISTORY: Patient's last menstrual period was 10/17/2009 (approximate). Contraception: BTL Menopausal hormone therapy:  none Last mammogram: 05-29-19 Neg/density C/BiRads1 Last pap smear: 05-19-19 Neg, 04-04-16 Neg:Neg HR HPV, 11-05-12 Neg:Neg HR HPV        OB History    Gravida  2   Para  2   Term  2   Preterm  0   AB  0   Living  2     SAB  0   TAB  0   Ectopic  0   Multiple  0   Live Births  2              Patient Active Problem List   Diagnosis Date Noted  . Hypertension   . Diverticulitis   . Colon polyps     Past Medical History:  Diagnosis Date  . Abnormal Pap smear of cervix    over 75yrs ago, just a repeat  . Anemia   . Colon polyps   . Diabetes mellitus without complication ([place])   . Diverticulitis   . Hypercholesterolemia   . Hypertension     Past Surgical History:  Procedure Laterality Date  . AMPUTATION Right 03/21/2014   Procedure: RIGHT LONG FINGER AMPUTATION REVISION;  Surgeon: Jolyn Nap, MD;  Location: [place];  Service: Orthopedics;  Laterality: Right;  . BREAST EXCISIONAL BIOPSY Right  30's   fibroadenoma  . COLONOSCOPY     multiple polyps and diverticulitis  . TUBAL LIGATION Bilateral 1987    Current Outpatient Medications  Medication Sig Dispense Refill  . cyclobenzaprine (FLEXERIL) 10 MG tablet Take 1 tablet by mouth daily as needed.    Marland Kitchen HYDROcodone-acetaminophen (NORCO) 10-325 MG tablet TAKE 1 TABLET AS NEEDED EVERY 6 HRS BY MOUTH (30 DAY SUPPLY)  0  . lisinopril-hydrochlorothiazide (PRINZIDE,ZESTORETIC) 20-25 MG per tablet Take 1 tablet by mouth daily.    . metFORMIN (GLUCOPHAGE) 500 MG tablet 1 TABLET WITH A MEAL TWICE A DAY ORALLY 30 DAY(S)  5  . simvastatin (ZOCOR) 40 MG tablet Take 40 mg by mouth daily.     No current facility-administered medications for this visit.     ALLERGIES: Patient has no known allergies.  Family History  Problem Relation Age of Onset  . Breast cancer Mother   . Hypertension Sister   . Heart failure Father   . Hypertension Brother   . Hypertension Sister   . Heart failure Sister   . Cancer Paternal Uncle        prostate cancer  .  Cancer Maternal Grandmother        ovarian    Social History   Socioeconomic History  . Marital status: Married    Spouse name: Not on file  . Number of children: Not on file  . Years of education: Not on file  . Highest education level: Not on file  Occupational History  . Not on file  Tobacco Use  . Smoking status: Former Research scientist (life sciences)  . Smokeless tobacco: Never Used  Substance and Sexual Activity  . Alcohol use: No  . Drug use: No  . Sexual activity: Yes    Partners: Male    Birth control/protection: Post-menopausal  Other Topics Concern  . Not on file  Social History Narrative  . Not on file   Social Determinants of Health   Financial Resource Strain:   . Difficulty of Paying Living Expenses:   Food Insecurity:   . Worried About Charity fundraiser in the Last Year:   . Arboriculturist in the Last Year:   Transportation Needs:   . Film/video editor (Medical):   Marland Kitchen Lack  of Transportation (Non-Medical):   Physical Activity:   . Days of Exercise per Week:   . Minutes of Exercise per Session:   Stress:   . Feeling of Stress :   Social Connections:   . Frequency of Communication with Friends and Family:   . Frequency of Social Gatherings with Friends and Family:   . Attends Religious Services:   . Active Member of Clubs or Organizations:   . Attends Archivist Meetings:   Marland Kitchen Marital Status:   Intimate Partner Violence:   . Fear of Current or Ex-Partner:   . Emotionally Abused:   Marland Kitchen Physically Abused:   . Sexually Abused:     Review of Systems  All other systems reviewed and are negative.   PHYSICAL EXAMINATION:    BP (!) 150/86   Pulse 84   Temp (!) 97.1 F (36.2 C) (Temporal)   Ht 5' 4.25" (1.632 m)   Wt 140 lb (63.5 kg)   LMP 10/17/2009 (Approximate)   BMI 23.84 kg/m     General appearance: alert, cooperative and appears stated age   Pelvic: External genitalia:  no lesions              Urethra:  normal appearing urethra with no masses, tenderness or lesions              Bartholins and Skenes: normal                 Vagina: normal appearing vagina with normal color and discharge, no lesions              Cervix: no lesions                Bimanual Exam:  Uterus:  normal size, contour, position, consistency, mobility, non-tender              Adnexa: no mass, fullness, tenderness              Rectal exam: Yes.  .  Confirms.              Anus:  normal sphincter tone, no lesions  Chaperone was present for exam.  Pelvic US Uterus with 3 fibroids, largest 2.3 cm.  Calcifications in myometrium.  EMS 1.96 mm.  Normal ovaries. No free fluid.   ASSESSMENT  Chronic LLQ pain.  I do not think this  is gynecologic in origin.  Fibroids.   Status post uterine artery embolization. FH breast and ovarian cancer.  Hs diverticulitis on chart review.  PLAN  Pelvic US findings and images reviewed.  In addition to reviewing [place], we had  discussion about causes of LLQ pain - musculoskeletal, urologic, GI. Motrin 800 mg every 8 hour as needed for pain.  She will follow up with her PCP regarding her pain.  I referred her for genetic counseling and testing due to her family history. FU here prn.   An After Visit Summary was printed and given to the patient.  32____ minute care time.

## 2019-12-04 NOTE — Progress Notes (Signed)
Encounter reviewed by Dr. Leanah Kolander Amundson C. Silva.  

## 2019-12-05 ENCOUNTER — Telehealth: Payer: Self-pay | Admitting: Genetic Counselor

## 2019-12-05 NOTE — Telephone Encounter (Signed)
Received a genetic counseling referral from Dr. Quincy Simmonds for fhx of breast and ovarian cancer. Cheryl Cain has been cld and scheduled for an in person on 7/19 at 9am. Pt aware to arrive 15 minutes early.

## 2019-12-26 DIAGNOSIS — R1032 Left lower quadrant pain: Secondary | ICD-10-CM | POA: Diagnosis not present

## 2019-12-29 ENCOUNTER — Other Ambulatory Visit: Payer: Self-pay | Admitting: Family Medicine

## 2019-12-29 DIAGNOSIS — R1032 Left lower quadrant pain: Secondary | ICD-10-CM

## 2020-01-05 ENCOUNTER — Inpatient Hospital Stay: Payer: BC Managed Care – PPO

## 2020-01-05 ENCOUNTER — Inpatient Hospital Stay: Payer: BC Managed Care – PPO | Admitting: Genetic Counselor

## 2020-01-19 ENCOUNTER — Other Ambulatory Visit: Payer: Self-pay | Admitting: Family Medicine

## 2020-01-20 ENCOUNTER — Other Ambulatory Visit: Payer: Self-pay | Admitting: Family Medicine

## 2020-01-20 ENCOUNTER — Other Ambulatory Visit: Payer: BC Managed Care – PPO

## 2020-01-31 ENCOUNTER — Other Ambulatory Visit: Payer: Self-pay

## 2020-01-31 ENCOUNTER — Ambulatory Visit
Admission: RE | Admit: 2020-01-31 | Discharge: 2020-01-31 | Disposition: A | Discharge status: Home / Self Care | Payer: BC Managed Care – PPO | Source: Ambulatory Visit | Attending: Family Medicine | Admitting: Family Medicine

## 2020-01-31 DIAGNOSIS — K449 Diaphragmatic hernia without obstruction or gangrene: Secondary | ICD-10-CM | POA: Diagnosis not present

## 2020-01-31 DIAGNOSIS — D259 Leiomyoma of uterus, unspecified: Secondary | ICD-10-CM | POA: Diagnosis not present

## 2020-01-31 DIAGNOSIS — K5732 Diverticulitis of large intestine without perforation or abscess without bleeding: Secondary | ICD-10-CM | POA: Diagnosis not present

## 2020-01-31 DIAGNOSIS — K7689 Other specified diseases of liver: Secondary | ICD-10-CM | POA: Diagnosis not present

## 2020-01-31 DIAGNOSIS — R1032 Left lower quadrant pain: Secondary | ICD-10-CM

## 2020-01-31 MED ORDER — IOPAMIDOL (ISOVUE-300) INJECTION 61%
100.0000 mL | Freq: Once | INTRAVENOUS | Status: AC | PRN
  Administered 2020-01-31: 100 mL via INTRAVENOUS

## 2020-03-01 ENCOUNTER — Other Ambulatory Visit: Payer: Self-pay | Admitting: Family Medicine

## 2020-03-01 DIAGNOSIS — R932 Abnormal findings on diagnostic imaging of liver and biliary tract: Secondary | ICD-10-CM

## 2020-04-20 DIAGNOSIS — E119 Type 2 diabetes mellitus without complications: Secondary | ICD-10-CM | POA: Diagnosis not present

## 2020-04-20 DIAGNOSIS — K769 Liver disease, unspecified: Secondary | ICD-10-CM | POA: Diagnosis not present

## 2020-04-20 DIAGNOSIS — Z23 Encounter for immunization: Secondary | ICD-10-CM | POA: Diagnosis not present

## 2020-04-20 DIAGNOSIS — E7800 Pure hypercholesterolemia, unspecified: Secondary | ICD-10-CM | POA: Diagnosis not present

## 2020-04-20 DIAGNOSIS — I10 Essential (primary) hypertension: Secondary | ICD-10-CM | POA: Diagnosis not present

## 2020-05-15 ENCOUNTER — Ambulatory Visit
Admission: RE | Admit: 2020-05-15 | Discharge: 2020-05-15 | Disposition: A | Discharge status: Home / Self Care | Payer: BC Managed Care – PPO | Source: Ambulatory Visit | Attending: Family Medicine | Admitting: Family Medicine

## 2020-05-15 ENCOUNTER — Other Ambulatory Visit: Payer: Self-pay

## 2020-05-15 DIAGNOSIS — R932 Abnormal findings on diagnostic imaging of liver and biliary tract: Secondary | ICD-10-CM

## 2020-06-05 ENCOUNTER — Ambulatory Visit
Admission: RE | Admit: 2020-06-05 | Discharge: 2020-06-05 | Disposition: A | Discharge status: Home / Self Care | Payer: BC Managed Care – PPO | Source: Ambulatory Visit | Attending: Family Medicine | Admitting: Family Medicine

## 2020-06-05 DIAGNOSIS — K760 Fatty (change of) liver, not elsewhere classified: Secondary | ICD-10-CM | POA: Diagnosis not present

## 2020-06-05 DIAGNOSIS — D1809 Hemangioma of other sites: Secondary | ICD-10-CM | POA: Diagnosis not present

## 2020-06-05 DIAGNOSIS — N281 Cyst of kidney, acquired: Secondary | ICD-10-CM | POA: Diagnosis not present

## 2020-06-05 DIAGNOSIS — K7689 Other specified diseases of liver: Secondary | ICD-10-CM | POA: Diagnosis not present

## 2020-06-05 MED ORDER — GADOBENATE DIMEGLUMINE 529 MG/ML IV SOLN
13.0000 mL | Freq: Once | INTRAVENOUS | Status: AC | PRN
  Administered 2020-06-05: 13 mL via INTRAVENOUS

## 2020-08-12 ENCOUNTER — Other Ambulatory Visit: Payer: Self-pay | Admitting: Family Medicine

## 2020-08-12 DIAGNOSIS — R911 Solitary pulmonary nodule: Secondary | ICD-10-CM

## 2020-08-21 DIAGNOSIS — Z1231 Encounter for screening mammogram for malignant neoplasm of breast: Secondary | ICD-10-CM | POA: Diagnosis not present

## 2020-09-25 ENCOUNTER — Other Ambulatory Visit: Payer: BC Managed Care – PPO

## 2020-09-25 ENCOUNTER — Ambulatory Visit
Admission: RE | Admit: 2020-09-25 | Discharge: 2020-09-25 | Disposition: A | Discharge status: Home / Self Care | Payer: BC Managed Care – PPO | Source: Ambulatory Visit | Attending: Family Medicine | Admitting: Family Medicine

## 2020-09-25 DIAGNOSIS — R911 Solitary pulmonary nodule: Secondary | ICD-10-CM

## 2020-09-25 DIAGNOSIS — R918 Other nonspecific abnormal finding of lung field: Secondary | ICD-10-CM | POA: Diagnosis not present

## 2020-10-21 DIAGNOSIS — I10 Essential (primary) hypertension: Secondary | ICD-10-CM | POA: Diagnosis not present

## 2020-10-21 DIAGNOSIS — E119 Type 2 diabetes mellitus without complications: Secondary | ICD-10-CM | POA: Diagnosis not present

## 2020-10-21 DIAGNOSIS — F5101 Primary insomnia: Secondary | ICD-10-CM | POA: Diagnosis not present

## 2020-10-21 DIAGNOSIS — E7800 Pure hypercholesterolemia, unspecified: Secondary | ICD-10-CM | POA: Diagnosis not present

## 2021-03-23 ENCOUNTER — Other Ambulatory Visit: Payer: Self-pay | Admitting: Family Medicine

## 2021-03-23 DIAGNOSIS — R911 Solitary pulmonary nodule: Secondary | ICD-10-CM

## 2021-04-23 ENCOUNTER — Ambulatory Visit
Admission: RE | Admit: 2021-04-23 | Discharge: 2021-04-23 | Disposition: A | Discharge status: Home / Self Care | Payer: BC Managed Care – PPO | Source: Ambulatory Visit | Attending: Family Medicine | Admitting: Family Medicine

## 2021-04-23 DIAGNOSIS — R911 Solitary pulmonary nodule: Secondary | ICD-10-CM

## 2021-04-23 DIAGNOSIS — R918 Other nonspecific abnormal finding of lung field: Secondary | ICD-10-CM | POA: Diagnosis not present

## 2021-04-25 DIAGNOSIS — F5101 Primary insomnia: Secondary | ICD-10-CM | POA: Diagnosis not present

## 2021-04-25 DIAGNOSIS — Z23 Encounter for immunization: Secondary | ICD-10-CM | POA: Diagnosis not present

## 2021-04-25 DIAGNOSIS — E7800 Pure hypercholesterolemia, unspecified: Secondary | ICD-10-CM | POA: Diagnosis not present

## 2021-04-25 DIAGNOSIS — E119 Type 2 diabetes mellitus without complications: Secondary | ICD-10-CM | POA: Diagnosis not present

## 2021-04-25 DIAGNOSIS — I10 Essential (primary) hypertension: Secondary | ICD-10-CM | POA: Diagnosis not present

## 2021-09-03 DIAGNOSIS — Z1231 Encounter for screening mammogram for malignant neoplasm of breast: Secondary | ICD-10-CM | POA: Diagnosis not present

## 2021-09-05 ENCOUNTER — Encounter: Payer: Self-pay | Admitting: Obstetrics and Gynecology

## 2021-09-20 DIAGNOSIS — L648 Other androgenic alopecia: Secondary | ICD-10-CM | POA: Diagnosis not present

## 2021-09-20 DIAGNOSIS — L80 Vitiligo: Secondary | ICD-10-CM | POA: Diagnosis not present

## 2021-11-03 ENCOUNTER — Ambulatory Visit: Payer: BC Managed Care – PPO | Admitting: Obstetrics and Gynecology

## 2021-11-03 DIAGNOSIS — E119 Type 2 diabetes mellitus without complications: Secondary | ICD-10-CM | POA: Diagnosis not present

## 2021-11-03 DIAGNOSIS — E7800 Pure hypercholesterolemia, unspecified: Secondary | ICD-10-CM | POA: Diagnosis not present

## 2021-11-03 DIAGNOSIS — F5101 Primary insomnia: Secondary | ICD-10-CM | POA: Diagnosis not present

## 2021-11-03 DIAGNOSIS — I10 Essential (primary) hypertension: Secondary | ICD-10-CM | POA: Diagnosis not present

## 2021-11-21 DIAGNOSIS — I10 Essential (primary) hypertension: Secondary | ICD-10-CM | POA: Diagnosis not present

## 2021-11-21 DIAGNOSIS — G8929 Other chronic pain: Secondary | ICD-10-CM | POA: Diagnosis not present

## 2022-03-20 ENCOUNTER — Encounter (HOSPITAL_COMMUNITY): Payer: Self-pay | Admitting: *Deleted

## 2022-03-20 ENCOUNTER — Other Ambulatory Visit: Payer: Self-pay

## 2022-03-20 ENCOUNTER — Ambulatory Visit (HOSPITAL_COMMUNITY)
Admission: EM | Admit: 2022-03-20 | Discharge: 2022-03-20 | Disposition: A | Discharge status: Home / Self Care | Payer: BC Managed Care – PPO | Attending: Internal Medicine | Admitting: Internal Medicine

## 2022-03-20 DIAGNOSIS — M25532 Pain in left wrist: Secondary | ICD-10-CM | POA: Diagnosis not present

## 2022-03-20 MED ORDER — IBUPROFEN 600 MG PO TABS: 600.0000 mg | ORAL_TABLET | Freq: Four times a day (QID) | ORAL | 0 refills | Status: AC | PRN

## 2022-03-20 NOTE — Discharge Instructions (Addendum)
Please wear your wrist brace at bedtime Rest and elevate the affected painful area Apply cold compresses intermittently as needed As pain recedes, begin normal activities slowly as tolerated.   Return to urgent care if symptoms persist. No indication for imaging at this time.

## 2022-03-20 NOTE — ED Triage Notes (Signed)
T reports since the first of August her Lt wrist has been sore. Pt denies any injury. Pt has been taking Ibuprofen for the pain with out relief.

## 2022-03-22 NOTE — ED Provider Notes (Signed)
[facility]    CSN: [id number] Arrival date & time: 03/20/22  0843      History   Chief Complaint Chief Complaint  Patient presents with   Wrist Pain    HPI Cheryl Cain is a 66 y.o. female was to the urgent care with left wrist pain of 3 to 4 weeks duration.  Patient describes the left wrist pain as throbbing and sharp in location.  Patient denies any trauma to the left wrist.  Her job involves repetitive hand movements.  Pain is worse when she wakes up in the morning and associated with mild swelling of the wrist.  No radiation of pain.  No numbness or tingling in the fingers.  HPI  Past Medical History:  Diagnosis Date   Abnormal Pap smear of cervix    over 37yr ago, just a repeat   Anemia    Colon polyps    Diabetes mellitus without complication ([place]    Diverticulitis    Hypercholesterolemia    Hypertension     Patient Active Problem List   Diagnosis Date Noted   Hypertension    Diverticulitis    Colon polyps     Past Surgical History:  Procedure Laterality Date   AMPUTATION Right 03/21/2014   Procedure: RIGHT LONG FINGER AMPUTATION REVISION;  Surgeon: DJolyn Nap MD;  Location: [facility]  Service: Orthopedics;  Laterality: Right;   BREAST EXCISIONAL BIOPSY Right 30's   fibroadenoma   COLONOSCOPY     multiple polyps and diverticulitis   TUBAL LIGATION Bilateral 1987    OB History     Gravida  2   Para  2   Term  2   Preterm  0   AB  0   Living  2      SAB  0   IAB  0   Ectopic  0   Multiple  0   Live Births  2            Home Medications    Prior to Admission medications   Medication Sig Start Date End Date Taking? Authorizing Provider  ibuprofen (ADVIL) 600 MG tablet Take 1 tablet (600 mg total) by mouth every 6 (six) hours as needed. 03/20/22  Yes Evanne Matsunaga, PMyrene Galas MD  cyclobenzaprine (FLEXERIL) 10 MG tablet Take 1 tablet by mouth daily as needed. 01/23/15   [provider]   HYDROcodone-acetaminophen (NORCO) 10-325 MG tablet TAKE 1 TABLET AS NEEDED EVERY 6 HRS BY MOUTH (30 DAY SUPPLY) 04/08/18   [provider]  lisinopril-hydrochlorothiazide (PRINZIDE,ZESTORETIC) 20-25 MG per tablet Take 1 tablet by mouth daily.    [provider]  metFORMIN (GLUCOPHAGE) 500 MG tablet 1 TABLET WITH A MEAL TWICE A DAY ORALLY 30 DAY(S) 04/03/17   [provider]  simvastatin (ZOCOR) 40 MG tablet Take 40 mg by mouth daily.    [provider]    Family History Family History  Problem Relation Age of Onset   Breast cancer Mother    Hypertension Sister    Heart failure Father    Hypertension Brother    Hypertension Sister    Heart failure Sister    Cancer Paternal Uncle        prostate cancer   Cancer Maternal Grandmother        ovarian    Social History Social History   Tobacco Use   Smoking status: Former   Smokeless tobacco: Never  Substance Use Topics   Alcohol use:  No   Drug use: No     Allergies   Patient has no known allergies.   Review of Systems Review of Systems  Constitutional: Negative.   HENT: Negative.    Cardiovascular: Negative.   Gastrointestinal: Negative.   Genitourinary: Negative.   Musculoskeletal:  Positive for arthralgias. Negative for myalgias, neck pain and neck stiffness.     Physical Exam Triage Vital Signs ED Triage Vitals  Enc Vitals Group     BP 03/20/22 0902 (!) 148/76     Pulse Rate 03/20/22 0902 100     Resp 03/20/22 0902 18     Temp 03/20/22 0902 98.6 F (37 C)     Temp src --      SpO2 03/20/22 0902 95 %     Weight --      Height --      Head Circumference --      Peak Flow --      Pain Score 03/20/22 0900 0     Pain Loc --      Pain Edu? --      Excl. in [place]? --    No data found.  Updated Vital Signs BP (!) 148/76   Pulse 100   Temp 98.6 F (37 C)   Resp 18   LMP 10/17/2009 (Approximate)   SpO2 95%   Visual Acuity Right Eye Distance:   Left Eye Distance:    Bilateral Distance:    Right Eye Near:   Left Eye Near:    Bilateral Near:     Physical Exam Vitals and nursing note reviewed.  Constitutional:      Appearance: Normal appearance.  Cardiovascular:     Rate and Rhythm: Normal rate and regular rhythm.  Musculoskeletal:        General: Tenderness present. Normal range of motion.     Comments: Left wrist tenderness.  Phalen's test is negative.  No hypothenar or thenar muscle atrophy.  Neurological:     Mental Status: She is alert.      UC Treatments / Results  Labs (all labs ordered are listed, but only abnormal results are displayed) Labs Reviewed - No data to display  EKG   Radiology No results found.  Procedures Procedures (including critical care time)  Medications Ordered in UC Medications - No data to display  Initial Impression / Assessment and Plan / UC Course  I have reviewed the triage vital signs and the nursing notes.  Pertinent labs & imaging results that were available during my care of the patient were reviewed by me and considered in my medical decision making (see chart for details).     1.  Left wrist pain: There is a concern that patient may have carpal tunnel syndrome Ibuprofen as needed for pain Left wrist brace Gentle range of motion exercises Icing of the left wrist Return precautions given No indication for imaging at this time. Final Clinical Impressions(s) / UC Diagnoses   Final diagnoses:  Left wrist pain     Discharge Instructions      Please wear your wrist brace at bedtime Rest and elevate the affected painful area Apply cold compresses intermittently as needed As pain recedes, begin normal activities slowly as tolerated.   Return to urgent care if symptoms persist. No indication for imaging at this time.    ED Prescriptions     Medication Sig Dispense Auth. Provider   ibuprofen (ADVIL) 600 MG tablet Take 1 tablet (600 mg total) by mouth  every 6 (six) hours as  needed. 30 tablet Jayde Daffin, Myrene Galas, MD      PDMP not reviewed this encounter.   Chase Picket, MD 03/22/22 [phone]

## 2022-04-24 ENCOUNTER — Other Ambulatory Visit: Payer: Self-pay | Admitting: Family Medicine

## 2022-04-24 DIAGNOSIS — G8929 Other chronic pain: Secondary | ICD-10-CM | POA: Diagnosis not present

## 2022-04-24 DIAGNOSIS — R911 Solitary pulmonary nodule: Secondary | ICD-10-CM

## 2022-04-24 DIAGNOSIS — I10 Essential (primary) hypertension: Secondary | ICD-10-CM | POA: Diagnosis not present

## 2022-04-24 DIAGNOSIS — Z23 Encounter for immunization: Secondary | ICD-10-CM | POA: Diagnosis not present

## 2022-04-24 DIAGNOSIS — E7800 Pure hypercholesterolemia, unspecified: Secondary | ICD-10-CM | POA: Diagnosis not present

## 2022-04-24 DIAGNOSIS — E119 Type 2 diabetes mellitus without complications: Secondary | ICD-10-CM | POA: Diagnosis not present

## 2022-06-27 ENCOUNTER — Other Ambulatory Visit: Payer: BC Managed Care – PPO

## 2022-07-25 ENCOUNTER — Ambulatory Visit
Admission: RE | Admit: 2022-07-25 | Discharge: 2022-07-25 | Disposition: A | Discharge status: Home / Self Care | Payer: Medicare Other | Source: Ambulatory Visit | Attending: Family Medicine | Admitting: Family Medicine

## 2022-07-25 DIAGNOSIS — R911 Solitary pulmonary nodule: Secondary | ICD-10-CM

## 2022-07-27 ENCOUNTER — Other Ambulatory Visit: Payer: Self-pay | Admitting: Family Medicine

## 2022-07-27 DIAGNOSIS — E041 Nontoxic single thyroid nodule: Secondary | ICD-10-CM

## 2022-08-07 ENCOUNTER — Ambulatory Visit
Admission: RE | Admit: 2022-08-07 | Discharge: 2022-08-07 | Disposition: A | Discharge status: Home / Self Care | Payer: Medicare Other | Source: Ambulatory Visit | Attending: Family Medicine | Admitting: Family Medicine

## 2022-08-07 DIAGNOSIS — E041 Nontoxic single thyroid nodule: Secondary | ICD-10-CM

## 2022-08-28 NOTE — Progress Notes (Signed)
67 y.o. G70P2002 Married [place] American female here for annual exam.    No GYN concerns.  Retired.   PCP:   Dr. Kenton Kingfisher  Patient's last menstrual period was 10/17/2009 (approximate).           Sexually active: Yes.    The current method of family planning is post menopausal status/BTL.    Exercising: Yes.     Rec center Smoker:  former  Health Maintenance: Pap:  05/19/19 neg, 04/04/16 neg History of abnormal Pap:  yes MMG:  09/05/21 Breast Density Category C, BI-RADS CATEGORY 1 Neg Colonoscopy:  2012.  Dr. Collene Mares.  She knows she is due for this.  BMD:   05/29/19  Result  WNL TDaP:  08/17/12.  She will do with her PCP.  Gardasil:   no Screening PCP   reports that she has quit smoking. She has never used smokeless tobacco. She reports that she does not drink alcohol and does not use drugs.  Past Medical History:  Diagnosis Date   Abnormal Pap smear of cervix    over 84yrs ago, just a repeat   Anemia    Colon polyps    Diabetes mellitus without complication ([place])    Diverticulitis    Hypercholesterolemia    Hypertension     Past Surgical History:  Procedure Laterality Date   AMPUTATION Right 03/21/2014   Procedure: RIGHT LONG FINGER AMPUTATION REVISION;  Surgeon: Jolyn Nap, MD;  Location: [facility];  Service: Orthopedics;  Laterality: Right;   BREAST EXCISIONAL BIOPSY Right 30's   fibroadenoma   COLONOSCOPY     multiple polyps and diverticulitis   TUBAL LIGATION Bilateral 1987    Current Outpatient Medications  Medication Sig Dispense Refill   amLODipine (NORVASC) 5 MG tablet Take 5 mg by mouth daily.     cyclobenzaprine (FLEXERIL) 10 MG tablet Take 1 tablet by mouth daily as needed.     ibuprofen (ADVIL) 600 MG tablet Take 1 tablet (600 mg total) by mouth every 6 (six) hours as needed. 30 tablet 0   lisinopril-hydrochlorothiazide (PRINZIDE,ZESTORETIC) 20-25 MG per tablet Take 1 tablet by mouth daily.     metFORMIN (GLUCOPHAGE) 500 MG tablet 1 TABLET WITH A MEAL  TWICE A DAY ORALLY 30 DAY(S)  5   simvastatin (ZOCOR) 40 MG tablet Take 40 mg by mouth daily.     HYDROcodone-acetaminophen (NORCO) 10-325 MG tablet TAKE 1 TABLET AS NEEDED EVERY 6 HRS BY MOUTH (30 DAY SUPPLY) (Patient not taking: Reported on 09/11/2022)  0   No current facility-administered medications for this visit.    Family History  Problem Relation Age of Onset   Breast cancer Mother    Hypertension Sister    Heart failure Father    Hypertension Brother    Hypertension Sister    Heart failure Sister    Cancer Paternal Uncle        prostate cancer   Cancer Maternal Grandmother        ovarian    Review of Systems  All other systems reviewed and are negative.   Exam:   BP 138/84 (BP Location: Right Arm, Patient Position: Sitting, Cuff Size: Normal)   Pulse (!) 115   Ht 5' 4.5" (1.638 m)   Wt 136 lb (61.7 kg)   LMP 10/17/2009 (Approximate)   SpO2 98%   BMI 22.98 kg/m     General appearance: alert, cooperative and appears stated age Head: normocephalic, without obvious abnormality, atraumatic Neck: no adenopathy, supple, symmetrical, trachea  midline and thyroid normal to inspection and palpation Lungs: clear to auscultation bilaterally Breasts: normal appearance, no masses or tenderness, No nipple retraction or dimpling, No nipple discharge or bleeding, No axillary adenopathy Heart: regular rate and rhythm Abdomen: soft, non-tender; no masses, no organomegaly Extremities: extremities normal, atraumatic, no cyanosis or edema Skin: skin color, texture, turgor normal. No rashes or lesions Lymph nodes: cervical, supraclavicular, and axillary nodes normal. Neurologic: grossly normal  Pelvic: External genitalia:  no lesions              No abnormal inguinal nodes palpated.              Urethra:  normal appearing urethra with no masses, tenderness or lesions              Bartholins and Skenes: normal                 Vagina: normal appearing vagina with normal color and  discharge, no lesions              Cervix: no lesions.              Pap taken: Yes.   Bimanual Exam:  Uterus:  normal size, contour, position, consistency, mobility, non-tender              Adnexa: no mass, fullness, tenderness              Rectal exam: yes.  Confirms.              Anus:  normal sphincter tone, no lesions  Chaperone was present for exam:  Raquel Sarna  Assessment:   Well woman visit with gynecologic exam. Status post uterine artery embolization for fibroids.  FH breast and ovarian cancer.  Hx right breast biopsy.  Colon cancer screening.   Plan: Mammogram screening discussed. Self breast awareness reviewed. Pap and HR HPV collected. Guidelines for Calcium, Vitamin D, regular exercise program including cardiovascular and weight bearing exercise. She will update her colonoscopy.   Fu in 2 years for breast and pelvic and FU prn.   After visit summary provided.

## 2022-09-11 ENCOUNTER — Ambulatory Visit (INDEPENDENT_AMBULATORY_CARE_PROVIDER_SITE_OTHER): Payer: BC Managed Care – PPO | Admitting: Obstetrics and Gynecology

## 2022-09-11 ENCOUNTER — Other Ambulatory Visit (HOSPITAL_COMMUNITY)
Admission: RE | Admit: 2022-09-11 | Discharge: 2022-09-11 | Disposition: A | Discharge status: Home / Self Care | Payer: Medicare Other | Source: Ambulatory Visit | Attending: Obstetrics and Gynecology | Admitting: Obstetrics and Gynecology

## 2022-09-11 ENCOUNTER — Encounter: Payer: Self-pay | Admitting: Obstetrics and Gynecology

## 2022-09-11 VITALS — BP 138/84 | HR 115 | Ht 64.5 in | Wt 136.0 lb

## 2022-09-11 DIAGNOSIS — Z1151 Encounter for screening for human papillomavirus (HPV): Secondary | ICD-10-CM | POA: Diagnosis not present

## 2022-09-11 DIAGNOSIS — Z01419 Encounter for gynecological examination (general) (routine) without abnormal findings: Secondary | ICD-10-CM

## 2022-09-11 DIAGNOSIS — Z124 Encounter for screening for malignant neoplasm of cervix: Secondary | ICD-10-CM | POA: Insufficient documentation

## 2022-09-11 NOTE — Patient Instructions (Signed)

## 2022-09-13 LAB — CYTOLOGY - PAP
Comment: NEGATIVE
Diagnosis: UNDETERMINED — AB
High risk HPV: NEGATIVE

## 2022-09-27 ENCOUNTER — Encounter: Payer: Self-pay | Admitting: Obstetrics and Gynecology

## 2022-10-01 IMAGING — CT CT CHEST W/O CM
1 series · 15 of 34 positions shown, 19 images · non-contrast
Comparison: January 31, 2020.

CLINICAL DATA: Lung nodules.

EXAM:
CT CHEST WITHOUT CONTRAST
TECHNIQUE: Multidetector CT imaging of the chest was performed following the
standard protocol without IV contrast.

[Series 2: chest w/(date) · axial · 0.76mm/px · z∈[-307,-57]mm · 15 of 147 slices shown, 19 images]
[im 11/147  mediastinal]
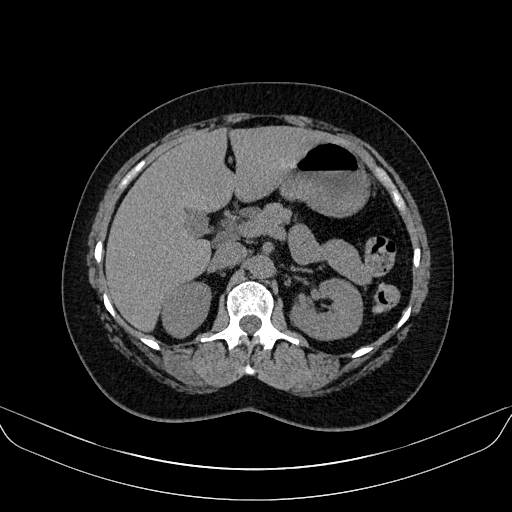
[im 11/147  lung]
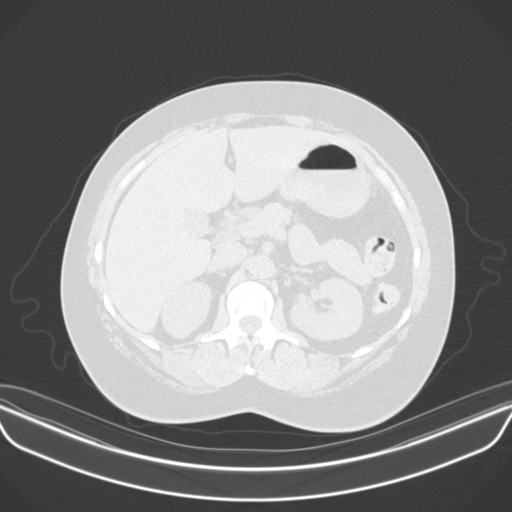
[im 22/147  lung]
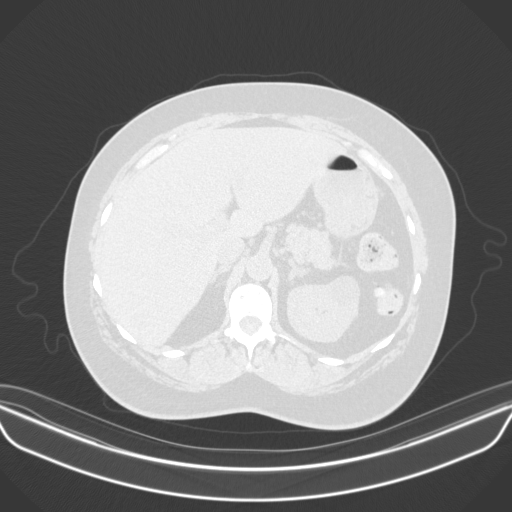
[im 30/147  lung]
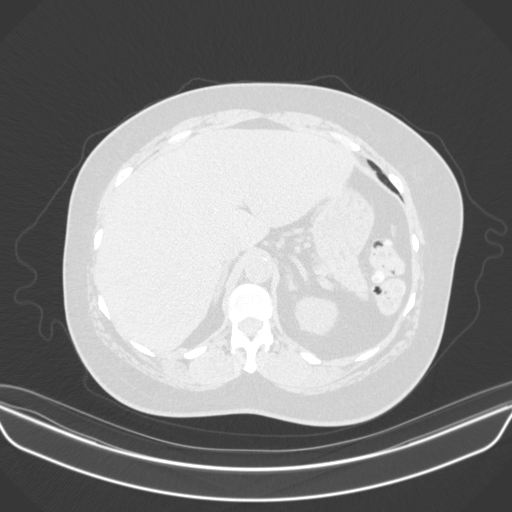
[im 38/147  lung]
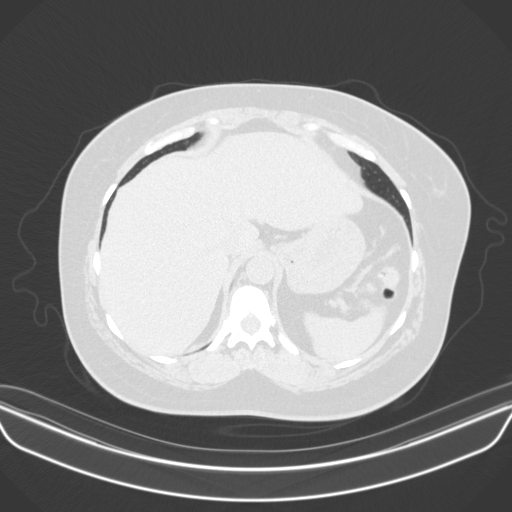
[im 49/147  mediastinal]
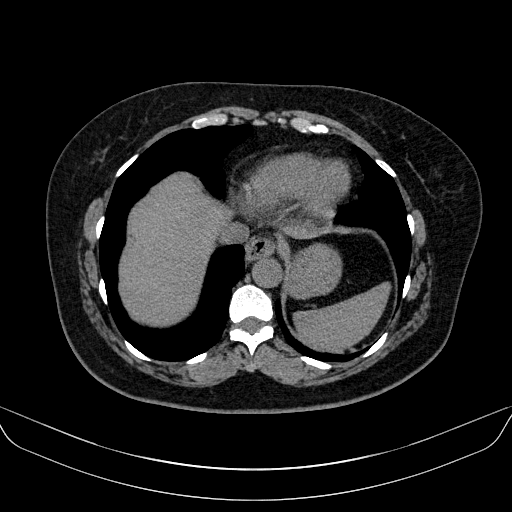
[im 49/147  lung]
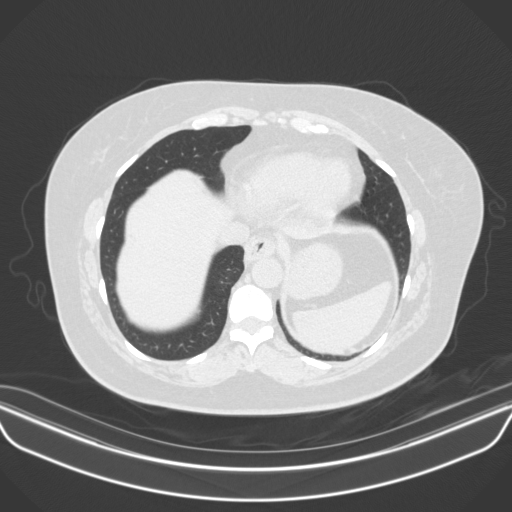
[im 59/147  lung]
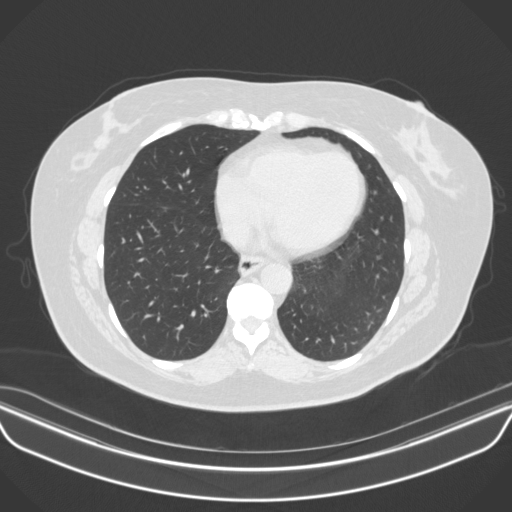
[im 65/147  lung]
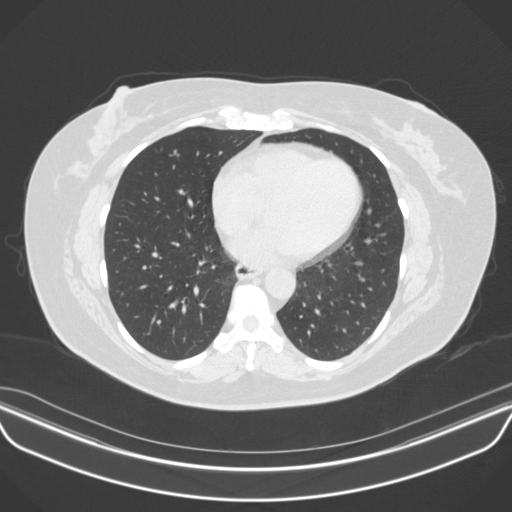
[im 76/147  lung]
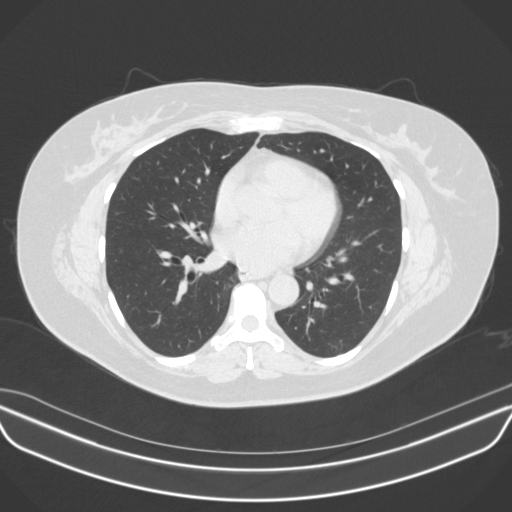
[im 82/147  mediastinal]
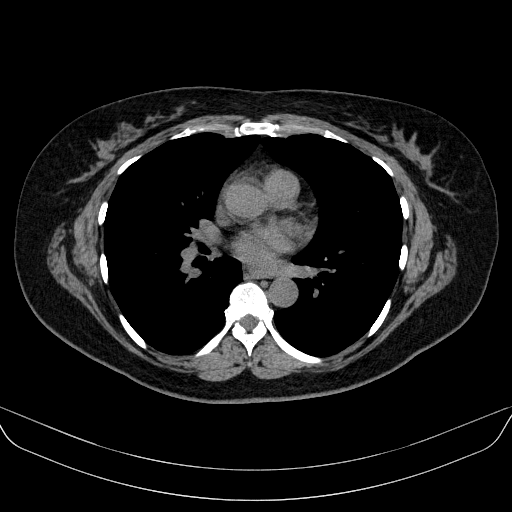
[im 82/147  lung]
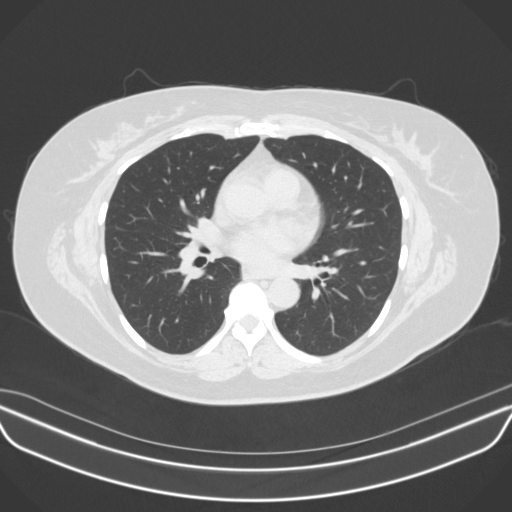
[im 88/147  lung]
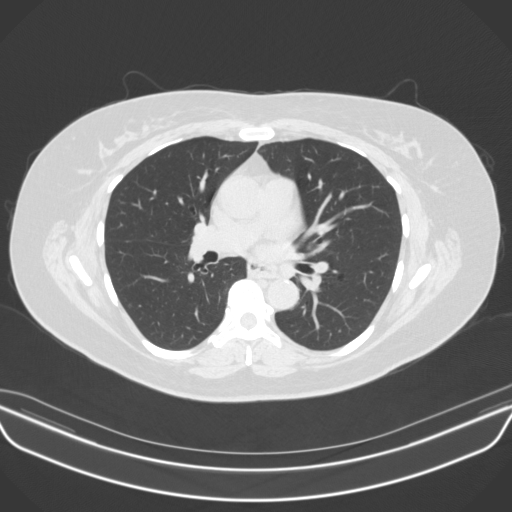
[im 98/147  lung]
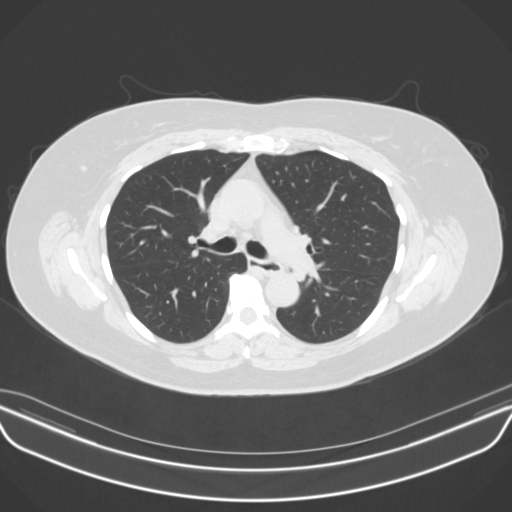
[im 109/147  lung]
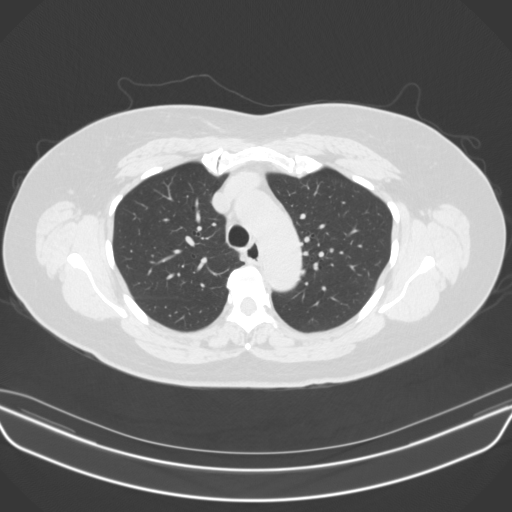
[im 117/147  mediastinal]
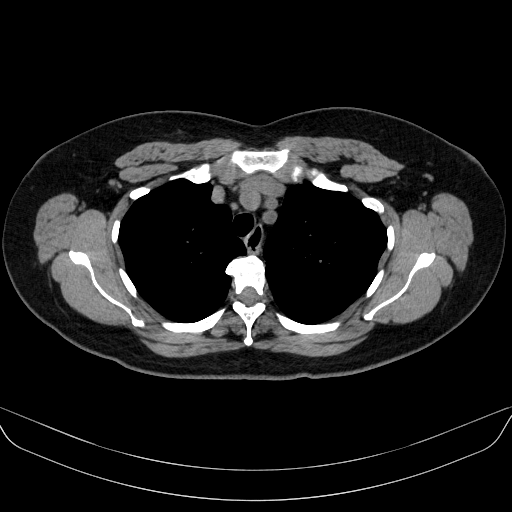
[im 117/147  lung]
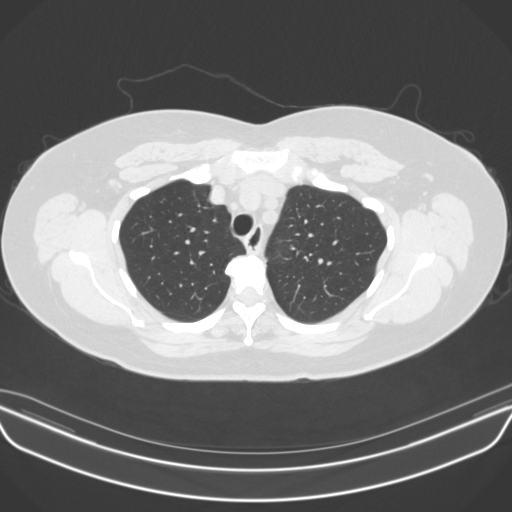
[im 125/147  lung]
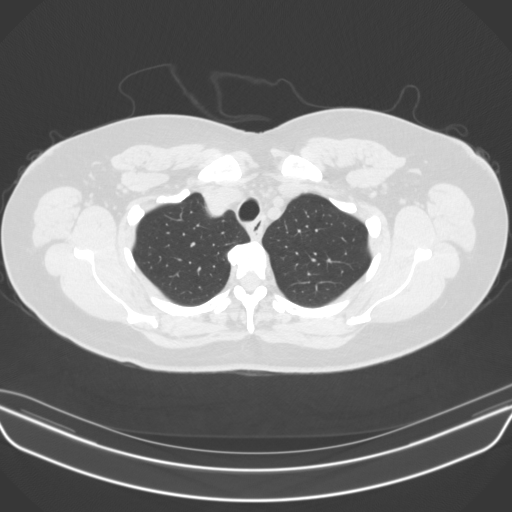
[im 136/147  lung]
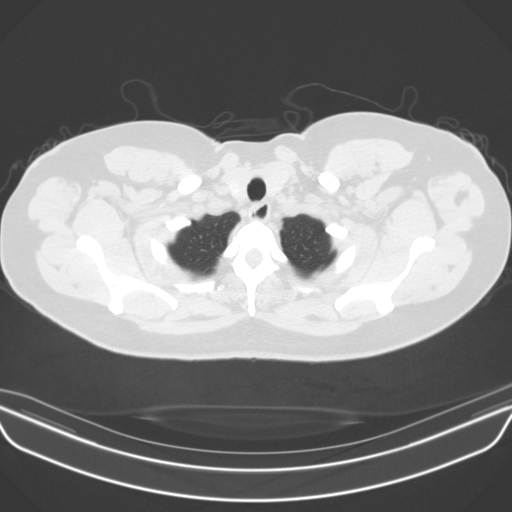

[15 of 34 positions shown; findings below may reference images not displayed]

FINDINGS: Cardiovascular: No significant vascular findings. Normal heart size.
No pericardial effusion.

Mediastinum/Nodes: No enlarged mediastinal or axillary lymph nodes.
Thyroid gland, trachea, and esophagus demonstrate no significant
findings.

Lungs/Pleura: No pneumothorax or pleural effusion is noted. Grossly
stable 11 mm nodule is noted medially in the right lower lobe best
seen on image number 94 series 5. Grossly stable 6 mm nodule is
noted in the right lower lobe best seen on image number 91 of series
5. 5 mm rounded nodule is noted in the right lower lobe best seen on
image number 75 of series 5. 5 mm rounded nodule is noted medially
in the right lower lobe best seen on image number 94 of series 5;
this is not seen on prior exam as it most likely was not included in
the field-of-view. 3 mm nodule is noted in right lung apex best seen
on image number 11 of series 5. 3 mm nodule seen in left upper lobe
best seen on image number 39 of series 5. 4 mm nodule is noted in
left lower lobe posteriorly best seen on image number 70 of series
5.

Upper Abdomen: No acute abnormality.

Musculoskeletal: No chest wall mass or suspicious bone lesions
identified.
IMPRESSION: Bilateral pulmonary nodules are noted, with the largest measuring 11
mm in the right lower lobe. This nodule is stable compared to the
prior exam of January 31, 2020. Most of the other visualized nodules
were not included in the field-of-view on the prior exam and
therefore their stability cannot be assessed currently. Non-contrast
chest CT at 3-6 months is recommended. If the nodules are stable at
time of repeat CT, then future CT at 18-24 months (from today's
scan) is considered optional for low-risk patients, but is
recommended for high-risk patients. This recommendation follows the
consensus statement: Guidelines for Management of Incidental
Pulmonary Nodules Detected on CT Images: From the [HOSPITAL]

## 2022-10-23 DIAGNOSIS — E7800 Pure hypercholesterolemia, unspecified: Secondary | ICD-10-CM | POA: Diagnosis not present

## 2022-10-23 DIAGNOSIS — I10 Essential (primary) hypertension: Secondary | ICD-10-CM | POA: Diagnosis not present

## 2022-10-23 DIAGNOSIS — E119 Type 2 diabetes mellitus without complications: Secondary | ICD-10-CM | POA: Diagnosis not present

## 2022-10-26 DIAGNOSIS — Z0000 Encounter for general adult medical examination without abnormal findings: Secondary | ICD-10-CM | POA: Diagnosis not present

## 2022-10-26 DIAGNOSIS — I10 Essential (primary) hypertension: Secondary | ICD-10-CM | POA: Diagnosis not present

## 2022-10-26 DIAGNOSIS — E119 Type 2 diabetes mellitus without complications: Secondary | ICD-10-CM | POA: Diagnosis not present

## 2022-10-26 DIAGNOSIS — E7800 Pure hypercholesterolemia, unspecified: Secondary | ICD-10-CM | POA: Diagnosis not present

## 2023-01-29 DIAGNOSIS — M545 Low back pain, unspecified: Secondary | ICD-10-CM | POA: Diagnosis not present

## 2023-01-29 DIAGNOSIS — E7800 Pure hypercholesterolemia, unspecified: Secondary | ICD-10-CM | POA: Diagnosis not present

## 2023-01-29 DIAGNOSIS — E119 Type 2 diabetes mellitus without complications: Secondary | ICD-10-CM | POA: Diagnosis not present

## 2023-01-29 DIAGNOSIS — I10 Essential (primary) hypertension: Secondary | ICD-10-CM | POA: Diagnosis not present

## 2023-05-03 DIAGNOSIS — M545 Low back pain, unspecified: Secondary | ICD-10-CM | POA: Diagnosis not present

## 2023-05-03 DIAGNOSIS — I10 Essential (primary) hypertension: Secondary | ICD-10-CM | POA: Diagnosis not present

## 2023-05-03 DIAGNOSIS — E7800 Pure hypercholesterolemia, unspecified: Secondary | ICD-10-CM | POA: Diagnosis not present

## 2023-05-03 DIAGNOSIS — E119 Type 2 diabetes mellitus without complications: Secondary | ICD-10-CM | POA: Diagnosis not present

## 2023-05-03 DIAGNOSIS — F5101 Primary insomnia: Secondary | ICD-10-CM | POA: Diagnosis not present

## 2023-08-06 ENCOUNTER — Other Ambulatory Visit: Payer: Self-pay | Admitting: Family Medicine

## 2023-08-06 DIAGNOSIS — E041 Nontoxic single thyroid nodule: Secondary | ICD-10-CM

## 2023-08-14 ENCOUNTER — Ambulatory Visit
Admission: RE | Admit: 2023-08-14 | Discharge: 2023-08-14 | Disposition: A | Discharge status: Home / Self Care | Payer: Medicare HMO | Source: Ambulatory Visit | Attending: Family Medicine | Admitting: Family Medicine

## 2023-08-14 DIAGNOSIS — E041 Nontoxic single thyroid nodule: Secondary | ICD-10-CM

## 2023-08-14 DIAGNOSIS — E042 Nontoxic multinodular goiter: Secondary | ICD-10-CM | POA: Diagnosis not present

## 2023-08-15 ENCOUNTER — Ambulatory Visit
Admission: EM | Admit: 2023-08-15 | Discharge: 2023-08-15 | Disposition: A | Discharge status: Home / Self Care | Payer: Medicare HMO | Attending: Physician Assistant | Admitting: Physician Assistant

## 2023-08-15 DIAGNOSIS — K0889 Other specified disorders of teeth and supporting structures: Secondary | ICD-10-CM | POA: Diagnosis not present

## 2023-08-15 MED ORDER — AMOXICILLIN 500 MG PO CAPS: 500.0000 mg | ORAL_CAPSULE | Freq: Three times a day (TID) | ORAL | 0 refills | Status: AC

## 2023-08-15 NOTE — ED Provider Notes (Signed)
 [name]    CSN: [id number] Arrival date & time: 08/15/23  6295      History   Chief Complaint Chief Complaint  Patient presents with   Dental Pain    HPI Cheryl Cain is a 68 y.o. female.   Patient here today for evaluation of dental pain to her upper central incisors that started a few days ago.  She reports that symptoms started after she went to [employer] for brunch.  She notes that symptoms started a few hours after eating and that she continues to have pain even with drinking.  Pain seemed to worsen overnight.  She has not had fever.  She notes she is currently trying to find a new dentist due to insurance coverage.  The history is provided by the patient.  Dental Pain Associated symptoms: no congestion and no fever     Past Medical History:  Diagnosis Date   Abnormal Pap smear of cervix    over 4yrs ago, just a repeat   Anemia    Colon polyps    Diabetes mellitus without complication (HCC)    Diverticulitis    Hypercholesterolemia    Hypertension     Patient Active Problem List   Diagnosis Date Noted   Hypertension    Diverticulitis    Colon polyps     Past Surgical History:  Procedure Laterality Date   AMPUTATION Right 03/21/2014   Procedure: RIGHT LONG FINGER AMPUTATION REVISION;  Surgeon: Jodi Marble, MD;  Location: [facility] OR;  Service: Orthopedics;  Laterality: Right;   BREAST EXCISIONAL BIOPSY Right 30's   fibroadenoma   COLONOSCOPY     multiple polyps and diverticulitis   TUBAL LIGATION Bilateral 1987    OB History     Gravida  2   Para  2   Term  2   Preterm  0   AB  0   Living  2      SAB  0   IAB  0   Ectopic  0   Multiple  0   Live Births  2            Home Medications    Prior to Admission medications   Medication Sig Start Date End Date Taking? Authorizing Provider  amoxicillin (AMOXIL) 500 MG capsule Take 1 capsule (500 mg total) by mouth 3 (three) times daily. 08/15/23  Yes  Tomi Bamberger, PA-C  amLODipine (NORVASC) 5 MG tablet Take 5 mg by mouth daily. 07/05/22   [provider]  cyclobenzaprine (FLEXERIL) 10 MG tablet Take 1 tablet by mouth daily as needed. 01/23/15   [provider]  HYDROcodone-acetaminophen (NORCO) 10-325 MG tablet TAKE 1 TABLET AS NEEDED EVERY 6 HRS BY MOUTH (30 DAY SUPPLY) Patient not taking: Reported on 09/11/2022 04/08/18   [provider]  ibuprofen (ADVIL) 600 MG tablet Take 1 tablet (600 mg total) by mouth every 6 (six) hours as needed. 03/20/22   LampteyBritta Mccreedy, MD  lisinopril-hydrochlorothiazide (PRINZIDE,ZESTORETIC) 20-25 MG per tablet Take 1 tablet by mouth daily.    [provider]  metFORMIN (GLUCOPHAGE) 500 MG tablet 1 TABLET WITH A MEAL TWICE A DAY ORALLY 30 DAY(S) 04/03/17   [provider]  simvastatin (ZOCOR) 40 MG tablet Take 40 mg by mouth daily.    [provider]    Family History Family History  Problem Relation Age of Onset   Breast cancer Mother    Hypertension Sister  Heart failure Father    Hypertension Brother    Hypertension Sister    Heart failure Sister    Cancer Paternal Uncle        prostate cancer   Cancer Maternal Grandmother        ovarian    Social History Social History   Tobacco Use   Smoking status: Former   Smokeless tobacco: Never  Substance Use Topics   Alcohol use: No   Drug use: No     Allergies   Patient has no known allergies.   Review of Systems Review of Systems  Constitutional:  Negative for chills and fever.  HENT:  Positive for dental problem. Negative for congestion.   Eyes:  Negative for discharge and redness.  Gastrointestinal:  Negative for abdominal pain, nausea and vomiting.     Physical Exam Triage Vital Signs ED Triage Vitals  Encounter Vitals Group     BP 08/15/23 0830 (!) 152/83     Systolic BP Percentile --      Diastolic BP Percentile --      Pulse Rate 08/15/23 0830 (!) 111     Resp  08/15/23 0830 18     Temp 08/15/23 0830 98.3 F (36.8 C)     Temp Source 08/15/23 0830 Oral     SpO2 08/15/23 0830 93 %     Weight --      Height --      Head Circumference --      Peak Flow --      Pain Score 08/15/23 0829 7     Pain Loc --      Pain Education --      Exclude from Growth Chart --    No data found.  Updated Vital Signs BP (!) 152/83 (BP Location: Right Arm)   Pulse (!) 111   Temp 98.3 F (36.8 C) (Oral)   Resp 18   LMP 10/17/2009 (Approximate)   SpO2 93%   Visual Acuity Right Eye Distance:   Left Eye Distance:   Bilateral Distance:    Right Eye Near:   Left Eye Near:    Bilateral Near:     Physical Exam Vitals and nursing note reviewed.  Constitutional:      General: She is not in acute distress.    Appearance: Normal appearance. She is not ill-appearing.  HENT:     Head: Normocephalic and atraumatic.     Nose: Nose normal. No congestion.     Mouth/Throat:     Comments: Mild gingival swelling and erythema noted to upper central incisor area Eyes:     Conjunctiva/sclera: Conjunctivae normal.  Cardiovascular:     Rate and Rhythm: Normal rate.  Pulmonary:     Effort: Pulmonary effort is normal.  Neurological:     Mental Status: She is alert.  Psychiatric:        Mood and Affect: Mood normal.        Behavior: Behavior normal.        Thought Content: Thought content normal.      UC Treatments / Results  Labs (all labs ordered are listed, but only abnormal results are displayed) Labs Reviewed - No data to display  EKG   Radiology US THYROID Result Date: 08/15/2023 CLINICAL DATA:  Prior ultrasound follow-up. EXAM: THYROID ULTRASOUND TECHNIQUE: Ultrasound examination of the thyroid gland and adjacent soft tissues was performed. COMPARISON:  08/07/2018 FINDINGS: Parenchymal Echotexture: Mildly heterogeneous Isthmus: 0.4 cm ,previously 0.5 cm Right lobe: 5.0 x  1.7 x 1.6 cm ,previously 5.0 x 2.1 x 1.7 cm Left lobe: 4.7 x 1.8 x 2.1 cm  ,previously 4.7 x 1.9 x 2.1 cm ________________________________________________________ Estimated total number of nodules >/= 1 cm: 1 Number of spongiform nodules >/=  2 cm not described below (TR1): 0 Number of mixed cystic and solid nodules >/= 1.5 cm not described below (TR2): 0 _________________________________________________________ Similar benign appearing solid nodule in the right mid thyroid (labeled 1, 0.9 cm, previously 1.0 cm). Similar benign appearing solid nodule in the right mid thyroid (labeled 2, 0.9 cm, previously 0.8 cm). Nodule # 3: Location: Right; Inferior Maximum size: 0.8 cm; Other 2 dimensions: 0.7 x 0.7 cm Composition: solid/almost completely solid (2) Echogenicity: isoechoic (1) Shape: not taller-than-wide (0) Margins: smooth (0) Echogenic foci: none (0) ACR TI-RADS total points: 3. ACR TI-RADS risk category: TR3 (3 points). ACR TI-RADS recommendations: Given size (<1.4 cm) and appearance, this nodule does NOT meet TI-RADS criteria for biopsy or dedicated follow-up. _________________________________________________________ Nodule # 4: Prior biopsy: No Location: Left; Mid Maximum size: 1.8 cm; Other 2 dimensions: 1.6 x 1.6 cm, previously, 1.7 x 1.7 x 1.5 cm Composition: spongiform (0) Echogenicity: hypoechoic (2) Shape: not taller-than-wide (0) Margins: smooth (0) Echogenic foci: none (0) ACR TI-RADS total points: 2. ACR TI-RADS risk category:  TR2 (2 points). Significant change in size (>/= 20% in two dimensions and minimal increase of 2 mm): No Change in features: Yes Change in ACR TI-RADS risk category: Yes ACR TI-RADS recommendations: This nodule does NOT meet TI-RADS criteria for biopsy or dedicated follow-up. _________________________________________________________ no cervical lymphadenopathy. IMPRESSION: 1. Previously visualized left mid thyroid nodule (labeled 4, 1.8 cm, previously 1.7 cm) now demonstrates spongiform sonographic character list 6 and is therefore benign (TI-RADS  category 2) not requiring additional follow-up. 2. Similar appearing multinodular thyroid with multifocal right-sided benign-appearing nodules that do not require additional follow-up. The above is in keeping with the ACR TI-RADS recommendations - J Am Coll Radiol 2017;14:587-595. Marliss Coots, MD [facility] Radiology Electronically Signed   By: Marliss Coots M.D.   On: 08/15/2023 09:23    Procedures Procedures (including critical care time)  Medications Ordered in UC Medications - No data to display  Initial Impression / Assessment and Plan / UC Course  I have reviewed the triage vital signs and the nursing notes.  Pertinent labs & imaging results that were available during my care of the patient were reviewed by me and considered in my medical decision making (see chart for details).    Will treat to cover possible abscess with amoxicillin and advised follow-up with dentistry as soon as possible.  Encouraged sooner follow-up with any worsening symptoms or further concerns.  Encouraged ibuprofen and Tylenol for pain if needed.  Final Clinical Impressions(s) / UC Diagnoses   Final diagnoses:  Pain, dental   Discharge Instructions   None    ED Prescriptions     Medication Sig Dispense Auth. Provider   amoxicillin (AMOXIL) 500 MG capsule Take 1 capsule (500 mg total) by mouth 3 (three) times daily. 21 capsule Tomi Bamberger, PA-C      PDMP not reviewed this encounter.   Tomi Bamberger, PA-C 08/15/23 [phone]

## 2023-08-15 NOTE — ED Triage Notes (Addendum)
 Pt presents with dental pain that onset on Saturday evening. She states, 'I went to [employer] for brunch and afterwards a couple hours later the dental pain started." Pt states the pain is in the front of her mouth above two front teeth. She states the pain exemplified overnight. She also states drinking makes it worse as well.

## 2023-09-29 DIAGNOSIS — Z1231 Encounter for screening mammogram for malignant neoplasm of breast: Secondary | ICD-10-CM | POA: Diagnosis not present

## 2023-10-03 ENCOUNTER — Encounter: Payer: Self-pay | Admitting: Obstetrics and Gynecology

## 2023-10-05 ENCOUNTER — Encounter: Payer: Self-pay | Admitting: Obstetrics and Gynecology

## 2023-11-08 DIAGNOSIS — I10 Essential (primary) hypertension: Secondary | ICD-10-CM | POA: Diagnosis not present

## 2023-11-08 DIAGNOSIS — M62838 Other muscle spasm: Secondary | ICD-10-CM | POA: Diagnosis not present

## 2023-11-08 DIAGNOSIS — E7800 Pure hypercholesterolemia, unspecified: Secondary | ICD-10-CM | POA: Diagnosis not present

## 2023-11-08 DIAGNOSIS — F411 Generalized anxiety disorder: Secondary | ICD-10-CM | POA: Diagnosis not present

## 2023-11-08 DIAGNOSIS — Z1211 Encounter for screening for malignant neoplasm of colon: Secondary | ICD-10-CM | POA: Diagnosis not present

## 2023-11-08 DIAGNOSIS — Z0000 Encounter for general adult medical examination without abnormal findings: Secondary | ICD-10-CM | POA: Diagnosis not present

## 2023-11-08 DIAGNOSIS — E119 Type 2 diabetes mellitus without complications: Secondary | ICD-10-CM | POA: Diagnosis not present

## 2023-12-28 DIAGNOSIS — Z1211 Encounter for screening for malignant neoplasm of colon: Secondary | ICD-10-CM | POA: Diagnosis not present
